# Patient Record
Sex: Male | Born: 1985 | Race: White | Hispanic: No | Marital: Single | Smoking: Never smoker
Health system: Southern US, Community
[De-identification: ages and names within clinical notes are randomized; demographics above are authoritative.]

## PROBLEM LIST (undated history)

## (undated) DIAGNOSIS — J45909 Unspecified asthma, uncomplicated: Secondary | ICD-10-CM

## (undated) DIAGNOSIS — F988 Other specified behavioral and emotional disorders with onset usually occurring in childhood and adolescence: Secondary | ICD-10-CM

## (undated) DIAGNOSIS — L509 Urticaria, unspecified: Secondary | ICD-10-CM

## (undated) DIAGNOSIS — R569 Unspecified convulsions: Secondary | ICD-10-CM

## (undated) DIAGNOSIS — R55 Syncope and collapse: Secondary | ICD-10-CM

## (undated) HISTORY — DX: Unspecified asthma, uncomplicated: J45.909

## (undated) HISTORY — DX: Syncope and collapse: R55

## (undated) HISTORY — DX: Urticaria, unspecified: L50.9

## (undated) HISTORY — DX: Unspecified convulsions: R56.9

## (undated) HISTORY — DX: Other specified behavioral and emotional disorders with onset usually occurring in childhood and adolescence: F98.8

---

## 2007-05-10 ENCOUNTER — Emergency Department (HOSPITAL_COMMUNITY): Admission: EM | Admit: 2007-05-10 | Discharge: 2007-05-10 | Payer: Self-pay | Admitting: Family Medicine

## 2011-09-10 ENCOUNTER — Telehealth: Payer: Self-pay

## 2011-09-10 NOTE — Telephone Encounter (Signed)
Pt needing refill on adderall

## 2011-09-14 ENCOUNTER — Telehealth: Payer: Self-pay | Admitting: Family Medicine

## 2011-09-14 ENCOUNTER — Other Ambulatory Visit: Payer: Self-pay | Admitting: Internal Medicine

## 2011-09-14 MED ORDER — AMPHETAMINE-DEXTROAMPHETAMINE 20 MG PO TABS: 20.0000 mg | ORAL_TABLET | Freq: Two times a day (BID) | ORAL | Status: DC

## 2011-09-14 NOTE — Telephone Encounter (Signed)
Patient notified rx ready to p/u.

## 2011-09-15 NOTE — Telephone Encounter (Signed)
This prescription was provided, the patient signed at pick-up.

## 2011-10-23 ENCOUNTER — Telehealth: Payer: Self-pay

## 2011-10-23 MED ORDER — AMPHETAMINE-DEXTROAMPHETAMINE 20 MG PO TABS: 20.0000 mg | ORAL_TABLET | Freq: Two times a day (BID) | ORAL | Status: DC

## 2011-10-23 NOTE — Telephone Encounter (Signed)
Chart pulled to PA 

## 2011-10-23 NOTE — Telephone Encounter (Signed)
PT IN NEED OF HIS ADDERALL. PLEASE CALL [phone]

## 2011-10-23 NOTE — Telephone Encounter (Signed)
Signed at [employer].  Needs ov in 5/13.

## 2011-10-23 NOTE — Telephone Encounter (Signed)
LMOM notifying patient rx ready for pick up. 

## 2011-12-02 ENCOUNTER — Telehealth: Payer: Self-pay

## 2011-12-02 NOTE — Telephone Encounter (Signed)
Looks like patient needs OV this month.  Can we rx x 1?

## 2011-12-02 NOTE — Telephone Encounter (Signed)
Pt would like his Adderall refilled.

## 2011-12-03 MED ORDER — AMPHETAMINE-DEXTROAMPHETAMINE 20 MG PO TABS: 20.0000 mg | ORAL_TABLET | Freq: Two times a day (BID) | ORAL | Status: DC

## 2011-12-03 NOTE — Telephone Encounter (Signed)
Done. Will need an OV for further refill

## 2011-12-03 NOTE — Telephone Encounter (Signed)
[facility] notifying patient info below, rx in p/up drawer.

## 2011-12-09 ENCOUNTER — Telehealth: Payer: Self-pay

## 2011-12-09 NOTE — Telephone Encounter (Signed)
Medication history was printed for patient to pick up to send to [facility] for drug screening reasons.

## 2011-12-09 NOTE — Telephone Encounter (Signed)
PT STATES HE WAS GIVEN ADDERALL BEFORE FROM [place] AND HE NEED A COPY OF THE PAPER  HAVE TO HAVE IT FOR LEGAL ISSUES. PLEASE CALL [phone]

## 2012-02-01 ENCOUNTER — Ambulatory Visit (INDEPENDENT_AMBULATORY_CARE_PROVIDER_SITE_OTHER): Payer: 59 | Admitting: Family Medicine

## 2012-02-01 VITALS — BP 122/75 | HR 73 | Temp 98.1°F | Resp 18 | Ht 69.5 in | Wt 176.0 lb

## 2012-02-01 DIAGNOSIS — F988 Other specified behavioral and emotional disorders with onset usually occurring in childhood and adolescence: Secondary | ICD-10-CM

## 2012-02-01 MED ORDER — AMPHETAMINE-DEXTROAMPHETAMINE 20 MG PO TABS: 20.0000 mg | ORAL_TABLET | Freq: Two times a day (BID) | ORAL | Status: DC

## 2012-02-01 NOTE — Progress Notes (Signed)
  Subjective:    Patient ID: Phillip Burns, male    DOB: [birth year], 24 y.o.   MRN: [id number]  HPI Phillip Burns is a 26 y.o. male Seen 05/23/11 - started on adderall 10mg  1/2 BID, then increased for symptom improvement since then.    Taking adderall 20mg  - 1 in am , then 1 in afternoon - about 2 in afternoon.  Same dose since December. School full time. Helping at night.  No insomnia. No palpitations. Getting A's.    Review of Systems  Constitutional: Negative for appetite change.  Respiratory: Negative for chest tightness.   Cardiovascular: Negative for chest pain and palpitations.  Psychiatric/Behavioral: Negative for disturbed wake/sleep cycle and dysphoric mood. The patient is not nervous/anxious.        Objective:   Physical Exam  Constitutional: He is oriented to person, place, and time. He appears well-developed and well-nourished.  HENT:  Head: Normocephalic and atraumatic.  Eyes: EOM are normal. Pupils are equal, round, and reactive to light.  Cardiovascular: Normal rate, regular rhythm and normal heart sounds.   No murmur heard. Pulmonary/Chest: Effort normal and breath sounds normal. He has no rales.  Neurological: He is alert and oriented to person, place, and time.  Skin: Skin is warm and dry.  Psychiatric: He has a normal mood and affect. His behavior is normal. Judgment and thought content normal.       Assessment & Plan:  Phillip Burns is a 26 y.o. male 1. ADD (attention deficit disorder)  amphetamine-dextroamphetamine (ADDERALL) 20 MG tablet   Stable symptoms.  Cont adderall at current dose.  Ok to refill for 5 more, then ov needed in 6 months.

## 2012-03-06 ENCOUNTER — Telehealth: Payer: Self-pay

## 2012-03-06 DIAGNOSIS — F988 Other specified behavioral and emotional disorders with onset usually occurring in childhood and adolescence: Secondary | ICD-10-CM

## 2012-03-06 NOTE — Telephone Encounter (Signed)
Phillip Burns needs monthly Adderall prescription.  20mg .  Please call when ready to pick up [phone].

## 2012-03-09 MED ORDER — AMPHETAMINE-DEXTROAMPHETAMINE 20 MG PO TABS: 20.0000 mg | ORAL_TABLET | Freq: Two times a day (BID) | ORAL | Status: DC

## 2012-03-09 NOTE — Telephone Encounter (Signed)
Notified pt Rx ready for p/up 

## 2012-03-09 NOTE — Telephone Encounter (Signed)
Done and printed

## 2012-04-12 ENCOUNTER — Telehealth: Payer: Self-pay

## 2012-04-12 DIAGNOSIS — F988 Other specified behavioral and emotional disorders with onset usually occurring in childhood and adolescence: Secondary | ICD-10-CM

## 2012-04-12 NOTE — Telephone Encounter (Signed)
Pt requesting adderall refill   [facility] phone [phone]

## 2012-04-13 MED ORDER — AMPHETAMINE-DEXTROAMPHETAMINE 20 MG PO TABS: 20.0000 mg | ORAL_TABLET | Freq: Two times a day (BID) | ORAL | Status: DC

## 2012-04-13 NOTE — Telephone Encounter (Signed)
Rx done and ready for pickup 

## 2012-04-13 NOTE — Telephone Encounter (Signed)
Spoke with pt advised  Rx ready to pick up. 

## 2012-04-14 ENCOUNTER — Telehealth: Payer: Self-pay

## 2012-04-14 NOTE — Telephone Encounter (Signed)
Spoke with patient, he needed a copy of medication for work.  Advised patient to go to pharmacy and get a print off copy from them.  Patient states understanding.

## 2012-04-14 NOTE — Telephone Encounter (Signed)
Patient is requesting to pick up a copy of all rxs written from July 1-present. Please call at [phone] when ready.

## 2012-04-14 NOTE — Telephone Encounter (Signed)
Has had Rx for Adderall 20mg  written in July. Lm for him to call me back about this.

## 2012-04-14 NOTE — Telephone Encounter (Signed)
I am really not sure on where I would find this information in the patient's chart.

## 2012-04-30 ENCOUNTER — Encounter: Payer: Self-pay | Admitting: Internal Medicine

## 2012-04-30 DIAGNOSIS — F988 Other specified behavioral and emotional disorders with onset usually occurring in childhood and adolescence: Secondary | ICD-10-CM | POA: Insufficient documentation

## 2012-04-30 DIAGNOSIS — Z0000 Encounter for general adult medical examination without abnormal findings: Secondary | ICD-10-CM | POA: Insufficient documentation

## 2012-04-30 DIAGNOSIS — J45909 Unspecified asthma, uncomplicated: Secondary | ICD-10-CM

## 2012-04-30 HISTORY — DX: Unspecified asthma, uncomplicated: J45.909

## 2012-05-12 ENCOUNTER — Ambulatory Visit: Payer: Self-pay | Admitting: Internal Medicine

## 2012-05-16 ENCOUNTER — Telehealth: Payer: Self-pay

## 2012-05-16 DIAGNOSIS — F988 Other specified behavioral and emotional disorders with onset usually occurring in childhood and adolescence: Secondary | ICD-10-CM

## 2012-05-16 NOTE — Telephone Encounter (Signed)
PT WOULD LIKE A REFILL ON ADDERALL. [facility]ST# [phone]

## 2012-05-17 MED ORDER — AMPHETAMINE-DEXTROAMPHETAMINE 20 MG PO TABS: 20.0000 mg | ORAL_TABLET | Freq: Two times a day (BID) | ORAL | Status: DC

## 2012-05-17 NOTE — Telephone Encounter (Signed)
Left message for him to advise ready to p/u

## 2012-05-17 NOTE — Telephone Encounter (Signed)
Rx done and ready for pickup 

## 2012-05-21 DIAGNOSIS — R569 Unspecified convulsions: Secondary | ICD-10-CM

## 2012-05-21 DIAGNOSIS — R55 Syncope and collapse: Secondary | ICD-10-CM

## 2012-05-21 HISTORY — DX: Unspecified convulsions: R56.9

## 2012-05-21 HISTORY — DX: Syncope and collapse: R55

## 2012-05-22 ENCOUNTER — Emergency Department (HOSPITAL_BASED_OUTPATIENT_CLINIC_OR_DEPARTMENT_OTHER): Payer: 59

## 2012-05-22 ENCOUNTER — Encounter (HOSPITAL_BASED_OUTPATIENT_CLINIC_OR_DEPARTMENT_OTHER): Payer: Self-pay | Admitting: *Deleted

## 2012-05-22 ENCOUNTER — Emergency Department (HOSPITAL_BASED_OUTPATIENT_CLINIC_OR_DEPARTMENT_OTHER)
Admission: EM | Admit: 2012-05-22 | Discharge: 2012-05-22 | Disposition: A | Discharge status: Home / Self Care | Payer: 59 | Attending: Emergency Medicine | Admitting: Emergency Medicine

## 2012-05-22 DIAGNOSIS — J45909 Unspecified asthma, uncomplicated: Secondary | ICD-10-CM | POA: Insufficient documentation

## 2012-05-22 DIAGNOSIS — G40802 Other epilepsy, not intractable, without status epilepticus: Secondary | ICD-10-CM | POA: Insufficient documentation

## 2012-05-22 DIAGNOSIS — R569 Unspecified convulsions: Secondary | ICD-10-CM

## 2012-05-22 LAB — CBC WITH DIFFERENTIAL/PLATELET
Eosinophils Absolute: 0.2 10*3/uL (ref 0.0–0.7)
Hemoglobin: 14.1 g/dL (ref 13.0–17.0)
Lymphocytes Relative: 32 % (ref 12–46)
Lymphs Abs: 3.2 10*3/uL (ref 0.7–4.0)
MCH: 27.4 pg (ref 26.0–34.0)
MCV: 78.3 fL (ref 78.0–100.0)
Monocytes Relative: 8 % (ref 3–12)
Neutrophils Relative %: 59 % (ref 43–77)
Platelets: 294 10*3/uL (ref 150–400)
RBC: 5.15 MIL/uL (ref 4.22–5.81)
WBC: 10.2 10*3/uL (ref 4.0–10.5)

## 2012-05-22 LAB — COMPREHENSIVE METABOLIC PANEL
ALT: 21 U/L (ref 0–53)
Alkaline Phosphatase: 65 U/L (ref 39–117)
BUN: 13 mg/dL (ref 6–23)
CO2: 19 mEq/L (ref 19–32)
GFR calc Af Amer: >90 mL/min (ref >90)
GFR calc non Af Amer: >90 mL/min (ref >90)
Glucose, Bld: 144 mg/dL — ABNORMAL HIGH (ref 70–99)
Potassium: 3.4 mEq/L — ABNORMAL LOW (ref 3.5–5.1)
Sodium: 136 mEq/L (ref 135–145)
Total Bilirubin: 0.6 mg/dL (ref 0.3–1.2)
Total Protein: 8 g/dL (ref 6.0–8.3)

## 2012-05-22 LAB — RAPID URINE DRUG SCREEN, HOSP PERFORMED
Amphetamines: POSITIVE — AB
Benzodiazepines: NOT DETECTED
Cocaine: NOT DETECTED
Tetrahydrocannabinol: NOT DETECTED

## 2012-05-22 LAB — URINALYSIS, ROUTINE W REFLEX MICROSCOPIC
Bilirubin Urine: NEGATIVE
Glucose, UA: NEGATIVE mg/dL
Hgb urine dipstick: NEGATIVE
Nitrite: NEGATIVE
Specific Gravity, Urine: 1.015 (ref 1.005–1.030)
pH: 5.5 (ref 5.0–8.0)

## 2012-05-22 NOTE — ED Notes (Signed)
Dr Rulon Abide, MD at bedside.
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
## 2012-05-22 NOTE — ED Notes (Signed)
Pt presents to ED today with +witnessed seizure by father.  Described as "shaking all over"  Pt does not have hx of same

## 2012-05-22 NOTE — ED Notes (Signed)
CBG 147. RN Joss Benton Notified.
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
## 2012-05-27 NOTE — ED Provider Notes (Signed)
Medical screening examination/treatment/procedure(s) were conducted as a shared visit with non-physician practitioner(s) and myself.  I personally evaluated the patient during the encounter [name], M.D.   [name], MD 05/27/12 1815

## 2012-06-18 ENCOUNTER — Other Ambulatory Visit: Payer: Self-pay

## 2012-06-18 DIAGNOSIS — F988 Other specified behavioral and emotional disorders with onset usually occurring in childhood and adolescence: Secondary | ICD-10-CM

## 2012-06-18 NOTE — Telephone Encounter (Signed)
PATIENT CALLED FOR A RX REFILL amphetamine-dextroamphetamine (ADDERALL) 20 MG tablet. THANK YOU!

## 2012-06-21 ENCOUNTER — Telehealth: Payer: Self-pay | Admitting: Family Medicine

## 2012-06-21 MED ORDER — AMPHETAMINE-DEXTROAMPHETAMINE 20 MG PO TABS: 20.0000 mg | ORAL_TABLET | Freq: Two times a day (BID) | ORAL | Status: DC

## 2012-06-21 NOTE — Telephone Encounter (Signed)
PT CALLED AGAIN TO FIND OUT ABOUT HIS ADDERALL. STATES HE NEED ASAP. PLEASE CALL [phone]

## 2012-06-21 NOTE — Telephone Encounter (Signed)
Message has been sent to Dr Neva Seat for review. Please advise on Adderall pended Rx. Patient due for follow up in Jan. Dillard Pascal

## 2012-06-21 NOTE — Telephone Encounter (Signed)
Called patient and notified adderall rx ready for pick up

## 2012-07-19 ENCOUNTER — Telehealth: Payer: Self-pay

## 2012-07-19 DIAGNOSIS — F988 Other specified behavioral and emotional disorders with onset usually occurring in childhood and adolescence: Secondary | ICD-10-CM

## 2012-07-19 NOTE — Telephone Encounter (Signed)
Pt would like a refill on adderall. Best# [phone]

## 2012-07-20 NOTE — Telephone Encounter (Signed)
Patient needs an appointment in January

## 2012-07-23 ENCOUNTER — Ambulatory Visit (INDEPENDENT_AMBULATORY_CARE_PROVIDER_SITE_OTHER): Payer: 59 | Admitting: Family Medicine

## 2012-07-23 VITALS — BP 126/72 | HR 68 | Temp 98.0°F | Resp 24 | Ht 68.0 in | Wt 179.0 lb

## 2012-07-23 DIAGNOSIS — R569 Unspecified convulsions: Secondary | ICD-10-CM

## 2012-07-23 DIAGNOSIS — R55 Syncope and collapse: Secondary | ICD-10-CM

## 2012-07-23 DIAGNOSIS — F988 Other specified behavioral and emotional disorders with onset usually occurring in childhood and adolescence: Secondary | ICD-10-CM

## 2012-07-23 MED ORDER — AMPHETAMINE-DEXTROAMPHETAMINE 20 MG PO TABS: 20.0000 mg | ORAL_TABLET | Freq: Two times a day (BID) | ORAL | Status: DC

## 2012-07-23 NOTE — Patient Instructions (Addendum)
1. ADD (attention deficit disorder)  amphetamine-dextroamphetamine (ADDERALL) 20 MG tablet, DISCONTINUED: amphetamine-dextroamphetamine (ADDERALL) 20 MG tablet, DISCONTINUED: amphetamine-dextroamphetamine (ADDERALL) 20 MG tablet     You will need follow-up in six months.  Please choose a specific physician or provider to see every time you come in.   Recommend evaluation by cardiology due to recent passing out episode.

## 2012-07-23 NOTE — Progress Notes (Signed)
[address]   [place], [place]  [address]   [phone]  Subjective:    Patient ID: Phillip Burns, male    DOB: 05/21/1986, 27 y.o.   MRN: [id number]  HPIThis 27 y.o. male presents for six month follow-up for ADD.  Starting school and new job.  In school at [employer] first year.  Also working for [employer] in [employer].  Diagnosed age 89 by Dr. Clarene Duke. Previous use of Ritalin, Concerta, Adderall.  Currently taking Adderall; takes daily.  Working well.  No side effects.  Sleeping well; no headaches; no palpitations.  No irritability.  Life is going well.  Emotionally stable.  2. Seizure activity:  Onset in 05/2012. Was with family and suffered a syncopal event followed by onset of seizure activity after falling.  Had not slept much the night before; had stayed up almost all night.   S/p ED visit; CT head negative; labs negative.  Referred to neurologist; went to [facility] ED in [place].  Then went through EEG, maybe MRI and neurology consultation.  ?Neurologist located off [address].  No medication warranted.    No recurrence.  No sleep on day of event.  No family history of seizures.  EKG in ED normal.  No cardiology evaluation or consultation.  Was to follow-up with neurology but did not.  Does not recall chest pain, palpitations, shortness of breath, dizziness.  Does not remember being lightheaded before syncopal event.  3. Health Maintenance: s/p flu vaccine this season.  Review of Systems  Constitutional: Negative for fever, chills, diaphoresis, fatigue and unexpected weight change.  HENT: Negative for hearing loss and tinnitus.   Eyes: Negative for photophobia, redness and visual disturbance.  Gastrointestinal: Negative for nausea, vomiting and diarrhea.  Neurological: Positive for seizures and syncope. Negative for dizziness, tremors, facial asymmetry, speech difficulty, weakness, light-headedness, numbness and headaches.  Psychiatric/Behavioral: Positive for decreased  concentration. Negative for suicidal ideas, hallucinations, sleep disturbance, self-injury and dysphoric mood. The patient is not nervous/anxious.         Past Medical History  Diagnosis Date  . Asthma 04/30/2012  . ADD (attention deficit disorder)   . Seizures 05/21/2012    single episode of seizure; ED visit; s/p neurology consultation with EEG.  . Syncope and collapse 05/21/2012    associated with subsequent seizure activity; ED visit wit CT head, EKG, labs negative.  s/p neurology consult negativ work up including EEG.    History reviewed. No pertinent past surgical history.  Prior to Admission medications   Medication Sig Start Date End Date Taking? Authorizing Provider  amphetamine-dextroamphetamine (ADDERALL) 20 MG tablet Take 1 tablet (20 mg total) by mouth 2 (two) times daily. DO NOT FILL UNTIL 09-20-2012. 07/23/12 08/22/12  Ethelda Chick, MD    No Known Allergies  History   Social History  . Marital Status: Single    Spouse Name: N/A    Number of Children: N/A  . Years of Education: N/A   Occupational History  . Not on file.   Social History Main Topics  . Smoking status: Never Smoker   . Smokeless tobacco: Never Used  . Alcohol Use: Yes  . Drug Use: No  . Sexually Active: Not on file   Other Topics Concern  . 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  Tobacco: none  Alcohol: socially   Drugs: none   Exercises:  Once weekly running.    History reviewed. No pertinent family history.  Objective:   Physical Exam  Nursing note and vitals reviewed. Constitutional: He is oriented to person, place, and time. He appears well-developed and well-nourished. No distress.  HENT:  Head: Normocephalic and atraumatic.  Right Ear: External ear normal.  Left Ear: External ear normal.  Nose: Nose normal.    Mouth/Throat: Oropharynx is clear and moist.  Eyes: Conjunctivae normal and EOM are normal. Pupils are equal, round, and reactive to light.  Neck: Normal range of motion. Neck supple. No thyromegaly present.  Cardiovascular: Normal rate, regular rhythm and normal heart sounds.  Exam reveals no gallop and no friction rub.   No murmur heard. Pulmonary/Chest: Effort normal and breath sounds normal. He has no wheezes. He has no rales.  Lymphadenopathy:    He has no cervical adenopathy.  Neurological: He is alert and oriented to person, place, and time. No cranial nerve deficit. He exhibits normal muscle tone. Coordination normal.  Skin: He is not diaphoretic.  Psychiatric: He has a normal mood and affect. His behavior is normal. Judgment and thought content normal.       Assessment & Plan:   1. ADD (attention deficit disorder)  amphetamine-dextroamphetamine (ADDERALL) 20 MG tablet, DISCONTINUED: amphetamine-dextroamphetamine (ADDERALL) 20 MG tablet, DISCONTINUED: amphetamine-dextroamphetamine (ADDERALL) 20 MG tablet  2. Syncope and collapse    3. Seizure      Meds ordered this encounter  Medications  . DISCONTD: amphetamine-dextroamphetamine (ADDERALL) 20 MG tablet    Sig: Take 1 tablet (20 mg total) by mouth 2 (two) times daily.    Dispense:  60 tablet    Refill:  0  . DISCONTD: amphetamine-dextroamphetamine (ADDERALL) 20 MG tablet    Sig: Take 1 tablet (20 mg total) by mouth 2 (two) times daily. DO NOT FILL UNTIL 08-23-2012.    Dispense:  60 tablet    Refill:  0  . amphetamine-dextroamphetamine (ADDERALL) 20 MG tablet    Sig: Take 1 tablet (20 mg total) by mouth 2 (two) times daily. DO NOT FILL UNTIL 09-20-2012.    Dispense:  60 tablet    Refill:  0

## 2012-07-29 ENCOUNTER — Encounter: Payer: Self-pay | Admitting: Family Medicine

## 2012-07-29 DIAGNOSIS — R55 Syncope and collapse: Secondary | ICD-10-CM | POA: Insufficient documentation

## 2012-07-29 DIAGNOSIS — R569 Unspecified convulsions: Secondary | ICD-10-CM | POA: Insufficient documentation

## 2012-07-29 NOTE — Assessment & Plan Note (Signed)
New. ED records reviewed in detail during visit.  Referred to neurology after ED visit.  Reports undergoing EEG and possible MRI with neurology consult; obtain records.  Recommend cardiology consultation to rule out cardiac origin of syncopal event; pt to consider but declined referral today.

## 2012-07-29 NOTE — Assessment & Plan Note (Signed)
New. Associated with syncopal event. ED records reviewed in detail.  S/p neurology consultation; obtain records.

## 2012-07-29 NOTE — Assessment & Plan Note (Signed)
Controlled; no change in medication at this time; discussed new office policy that must see same provider for each refill.  Also discussed office policy on controlled substances; pt expressed understanding.  Also recommend making appointments at [facility] center for all follow-ups to confirm that patient seeing the same provider each visit for ADD management.

## 2012-08-17 ENCOUNTER — Telehealth: Payer: Self-pay | Admitting: Radiology

## 2012-08-17 NOTE — Telephone Encounter (Signed)
[phone] pharmacy called, patient wants to get his Adderall filled early it is not due to be filled until Feb 2nd patient states he is going out of town.

## 2012-08-17 NOTE — Telephone Encounter (Signed)
OK with early refill as long as patient does not have a history of frequent early refills of Adderall.  Please confirm with pharmacy. KMS

## 2012-08-18 NOTE — Telephone Encounter (Signed)
Pharmacy notified that it was ok to fill. Pt does not have history of filling early.

## 2012-10-26 ENCOUNTER — Other Ambulatory Visit: Payer: Self-pay | Admitting: *Deleted

## 2012-10-26 ENCOUNTER — Ambulatory Visit (INDEPENDENT_AMBULATORY_CARE_PROVIDER_SITE_OTHER): Payer: 59 | Admitting: Family Medicine

## 2012-10-26 VITALS — BP 116/68 | HR 84 | Temp 98.1°F | Resp 16 | Ht 68.5 in | Wt 174.0 lb

## 2012-10-26 DIAGNOSIS — F988 Other specified behavioral and emotional disorders with onset usually occurring in childhood and adolescence: Secondary | ICD-10-CM

## 2012-10-26 MED ORDER — AMPHETAMINE-DEXTROAMPHETAMINE 20 MG PO TABS: 20.0000 mg | ORAL_TABLET | Freq: Two times a day (BID) | ORAL | Status: DC

## 2012-10-26 MED ORDER — AMPHETAMINE-DEXTROAMPHETAMINE 20 MG PO TABS: ORAL_TABLET | ORAL | Status: DC

## 2012-10-26 NOTE — Progress Notes (Signed)
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 He was originally prescribed by Dr. Clarene Duke. Dr. Dallas Schimke was the initial prescriber here. He has gotten his prescriptions from various peoples in the past at this office. He was given the  controlled substances policy last visit, but he didn't know the names of who to attach himself to. He takes the medicine 20 mg twice daily on a fairly consistent basis since he has classes every day 5 days a week. The medicine directly helps him.  Objective: Physical exam not done today  Assessment: ADHD  Plan: Refills for April May and June. He is to return in July and instructed to see Dr. Patsy Lager at that time since she was the original prescriber at this practice and had reviewed Dr. Clarene Duke notes

## 2012-10-26 NOTE — Patient Instructions (Signed)
Take medication as directed.  See Dr. Shanda Bumps Copland in July. She is the only one who will be prescribing your medication. If you come when she is not here, you will have to come back another time.  [facility] Policy for Prescribing Controlled Substances (Revised 05/2012) 1. Prescriptions for controlled substances will be filled by ONE provider at [facility] with whom you have established and developed a plan for your care, including follow-up. 2. You are encouraged to schedule an appointment with your prescriber at our appointment center for follow-up visits whenever possible. 3. If you request a prescription for the controlled substance while at [facility] for an acute problem (with someone other than your regular prescriber), you MAY be given a ONE-TIME prescription for a 30-day supply of the controlled substance, to allow time for you to return to see your regular prescriber for additional prescriptions. 4.

## 2012-12-23 ENCOUNTER — Telehealth: Payer: Self-pay

## 2012-12-23 NOTE — Telephone Encounter (Signed)
[facility]. Called patient to advise.

## 2012-12-23 NOTE — Telephone Encounter (Signed)
Pt is needing to talk with someone about getting his adderall rx refilled early  Best number [phone]

## 2012-12-23 NOTE — Telephone Encounter (Signed)
Pt is needing to talk with someone about getting a refill of his adderall early  Best number [phone]

## 2012-12-23 NOTE — Telephone Encounter (Signed)
Ok to fill 12/26/12 rx early, needs to follow up in July as planned.  Needs to establish with one provider to prescribe this

## 2012-12-23 NOTE — Telephone Encounter (Signed)
Why does he need it early? [facility] [address], he has to go for [employer] assignment on June 7th so he needs this today or tomorrow. Is this okay.

## 2013-02-04 ENCOUNTER — Ambulatory Visit (INDEPENDENT_AMBULATORY_CARE_PROVIDER_SITE_OTHER): Payer: 59 | Admitting: Family Medicine

## 2013-02-04 VITALS — BP 114/72 | HR 75 | Temp 98.1°F | Resp 18 | Ht 68.5 in | Wt 179.0 lb

## 2013-02-04 DIAGNOSIS — F988 Other specified behavioral and emotional disorders with onset usually occurring in childhood and adolescence: Secondary | ICD-10-CM

## 2013-02-04 MED ORDER — AMPHETAMINE-DEXTROAMPHETAMINE 20 MG PO TABS: ORAL_TABLET | ORAL | Status: DC

## 2013-02-04 NOTE — Patient Instructions (Addendum)
Please contact us by phone or e-mail in 3 months- we can give you 3 months more of adderall then, but will need to see you in 6 months.   Take care!

## 2013-02-04 NOTE — Progress Notes (Signed)
[facility] [address] [place] [address] [phone]  Date:  02/04/2013   Name:  Phillip Burns   DOB:  August 13, 1985   MRN:  [id number]  PCP:  Evette Georges, MD    Chief Complaint: rx refill   History of Present Illness:  Phillip Burns is a 27 y.o. very pleasant male patient who presents with the following:  He is here today to refill his adderall.  He is Clinical research associate, and works in Office manager.  He is tranfering to [employer] but is not quite sure when he will finish school  He takes aderall 20 BID.  He has been on this dose for some time and feels that it controls his sx quite well.   His appetite is ok, he is sleeping well.   No tobacco, no drugs.  He did have one seizure in January which was thought to be due to sleep deprivation. This has not recurred and he is not on any anti seizure medications   Patient Active Problem List   Diagnosis Date Noted  . Syncope and collapse 07/29/2012  . Seizure 07/29/2012  . Asthma 04/30/2012  . Preventative health care 04/30/2012  . ADD (attention deficit disorder)     Past Medical History  Diagnosis Date  . Asthma 04/30/2012  . ADD (attention deficit disorder)   . Seizures 05/21/2012    single episode of seizure; ED visit; s/p neurology consultation with EEG.  . Syncope and collapse 05/21/2012    associated with subsequent seizure activity; ED visit wit CT head, EKG, labs negative.  s/p neurology consult negativ work up including EEG.    History reviewed. No pertinent past surgical history.  History  Substance Use Topics  . Smoking status: Never Smoker   . Smokeless tobacco: Never Used  . Alcohol Use: Yes    History reviewed. No pertinent family history.  No Known Allergies  Medication list has been reviewed and updated.  Current Outpatient Prescriptions on File Prior to Visit  Medication Sig Dispense Refill  . amphetamine-dextroamphetamine (ADDERALL) 20 MG tablet Take one twice  daily. Do not fill until 11/25/2012  60 tablet  0  . amphetamine-dextroamphetamine (ADDERALL) 20 MG tablet Take one twice daily. Do not fill until 12/26/2012  60 tablet  0  . amphetamine-dextroamphetamine (ADDERALL) 20 MG tablet Take 1 tablet (20 mg total) by mouth 2 (two) times daily.  60 tablet  0   No current facility-administered medications on file prior to visit.    Review of Systems:  As per HPI- otherwise negative.   Physical Examination: Filed Vitals:   02/04/13 1051  BP: 114/72  Pulse: 99  Temp: 98.1 F (36.7 C)  Resp: 18   Filed Vitals:   02/04/13 1051  Height: 5' 8.5" (1.74 m)  Weight: 179 lb (81.194 kg)   Body mass index is 26.82 kg/(m^2). Ideal Body Weight: Weight in (lb) to have BMI = 25: 166.5  GEN: WDWN, NAD, Non-toxic, A & O x 3, looks well HEENT: Atraumatic, Normocephalic. Neck supple. No masses, No LAD. Ears and Nose: No external deformity. CV: RRR, No M/G/R. No JVD. No thrill. No extra heart sounds. PULM: CTA B, no wheezes, crackles, rhonchi. No retractions. No resp. distress. No accessory muscle use. EXTR: No c/c/e NEURO Normal gait.  PSYCH: Normally interactive. Conversant. Not depressed or anxious appearing.  Calm demeanor.    Assessment and Plan: ADD (attention deficit disorder) - Plan: amphetamine-dextroamphetamine (ADDERALL) 20 MG tablet, amphetamine-dextroamphetamine (ADDERALL)  20 MG tablet, amphetamine-dextroamphetamine (ADDERALL) 20 MG tablet  Refilled adderall for 3 months.  Recheck in 6 months.    Signed Abbe Amsterdam, MD

## 2013-03-10 ENCOUNTER — Ambulatory Visit: Payer: 59 | Admitting: Family Medicine

## 2013-03-29 ENCOUNTER — Telehealth: Payer: Self-pay

## 2013-03-29 NOTE — Telephone Encounter (Signed)
PT STATES HE WILL BE LEAVING FOR 3 WEEKS DUE TO SERVICE AND WOULD LIKE TO HAVE HIS ADDERALL REFILLED AGAIN PLEASE CALL [phone]

## 2013-03-29 NOTE — Telephone Encounter (Signed)
He should have one for September. He wants to know if he can fill early since he is going out of town for [employer] service please advise. [facility] [address]

## 2013-03-30 NOTE — Telephone Encounter (Signed)
He has an rx dated to fill 9/15.  Spoke with pharmD and they can fill it early.  However, he may have to pay as it is too early for refill.  He will speak with his insurance company if necessary

## 2013-05-09 ENCOUNTER — Encounter: Payer: Self-pay | Admitting: Family Medicine

## 2013-05-09 ENCOUNTER — Ambulatory Visit (INDEPENDENT_AMBULATORY_CARE_PROVIDER_SITE_OTHER): Payer: 59 | Admitting: Family Medicine

## 2013-05-09 VITALS — BP 110/70 | Temp 98.7°F | Ht 69.0 in | Wt 181.0 lb

## 2013-05-09 DIAGNOSIS — R55 Syncope and collapse: Secondary | ICD-10-CM

## 2013-05-09 DIAGNOSIS — F988 Other specified behavioral and emotional disorders with onset usually occurring in childhood and adolescence: Secondary | ICD-10-CM

## 2013-05-09 DIAGNOSIS — R569 Unspecified convulsions: Secondary | ICD-10-CM

## 2013-05-09 MED ORDER — LISDEXAMFETAMINE DIMESYLATE 20 MG PO CAPS: 20.0000 mg | ORAL_CAPSULE | ORAL | Status: DC

## 2013-05-09 NOTE — Patient Instructions (Signed)
Let's try the long-acting medication,,,,,,,,vyvanse ........ it's a 20 mg pill take one in the morning  Call in 2 weeks with a progress report and leave a voicemail with Fleet Contras

## 2013-05-09 NOTE — Progress Notes (Signed)
  Subjective:    Patient ID: Phillip Burns, male    DOB: 1986-04-11, 27 y.o.   MRN: [id number]  HPI Phillip Burns is a 27 year old single male nonsmoker son of a patient of mine ,,,,,,, Crecencio Mc,,,,,,,,, who comes in today as a new patient  He's always been in excellent health he said no chronic health problems. Her urine half ago he had a seizure. He was taken emergency room subsequent neurologic evaluation should the seizure was secondary to sleep deprivation. He was placed on no medication and has not had any recurrence.  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 Clarene Duke at age 27 have ADD. He was on Ritalin Concerta and Adderall but the Adderall makes him feel jumpy and nervous he would like to know if there is any other options. He's never tried any of the newer medications  Does not smoke only drinks occasional drink of alcohol.  Vaccinations up-to-date all of her up-to-date when he went the [employer] in 2009   Review of Systems Review of systems otherwise negative    Objective:   Physical Exam  Well-developed well-nourished male no acute distress vital signs stable he is afebrile      Assessment & Plan:  Healthy male  Adult ADD trial ,,,vyvanse

## 2013-06-09 ENCOUNTER — Telehealth: Payer: Self-pay | Admitting: Family Medicine

## 2013-06-09 DIAGNOSIS — F988 Other specified behavioral and emotional disorders with onset usually occurring in childhood and adolescence: Secondary | ICD-10-CM

## 2013-06-09 MED ORDER — LISDEXAMFETAMINE DIMESYLATE 20 MG PO CAPS: 20.0000 mg | ORAL_CAPSULE | Freq: Every day | ORAL | Status: DC

## 2013-06-09 MED ORDER — LISDEXAMFETAMINE DIMESYLATE 20 MG PO CAPS: 20.0000 mg | ORAL_CAPSULE | ORAL | Status: DC

## 2013-06-09 NOTE — Telephone Encounter (Signed)
Rx ready for pick up and patient is aware 

## 2013-06-09 NOTE — Telephone Encounter (Signed)
Pt request refill lisdexamfetamine (VYVANSE) 20 MG capsule °

## 2013-08-02 ENCOUNTER — Telehealth: Payer: Self-pay | Admitting: Family Medicine

## 2013-08-02 DIAGNOSIS — F988 Other specified behavioral and emotional disorders with onset usually occurring in childhood and adolescence: Secondary | ICD-10-CM

## 2013-08-02 MED ORDER — LISDEXAMFETAMINE DIMESYLATE 20 MG PO CAPS: 20.0000 mg | ORAL_CAPSULE | Freq: Two times a day (BID) | ORAL | Status: DC

## 2013-08-02 NOTE — Telephone Encounter (Signed)
Okay per Dr Todd.  Rx ready for pick up and patient is aware. 

## 2013-08-02 NOTE — Telephone Encounter (Signed)
Pt would like to up his rx lisdexamfetamine (VYVANSE) 20 MG capsule, working just needs more of. Pt has one left but would like to trade that one in. 3 mo supply

## 2013-08-02 NOTE — Telephone Encounter (Signed)
Patient would like to take Vyvanse 20 mg twice daily. Is this okay?

## 2013-09-16 ENCOUNTER — Telehealth: Payer: Self-pay | Admitting: Family Medicine

## 2013-09-16 NOTE — Telephone Encounter (Signed)
Patient calling into to triage line with concerns about Vyvance. Attempted to contact the patient back at [phone]. Reached voicemail. Left message to please return call to office for assistance. No contact.

## 2013-09-16 NOTE — Telephone Encounter (Signed)
Pt called back to asked if he could have an increase in his vyvanse. Not working that well, pt states he told triage nurse all the information. (?)  pls call

## 2013-09-19 NOTE — Telephone Encounter (Signed)
Spoke with patient and he was advised to call Dr Nolen MuMckinney per Dr Tawanna Coolerodd.

## 2013-09-24 IMAGING — CT CT HEAD W/O CM
2 series · 16 of 30 positions shown, 20 images · non-contrast
Comparison: None.

CLINICAL DATA: Seizure.  Stuck left eye object while falling.  Left
eye hematoma

CT HEAD WITHOUT CONTRAST
TECHNIQUE: Contiguous axial images were obtained from the base of
the skull through the vertex without contrast.

[Series 2: head 4.8 h37s · axial · 0.46mm/px · z∈[-92,+51]mm · 13 of 36 slices shown, 17 images]
[im 3/36  brain]
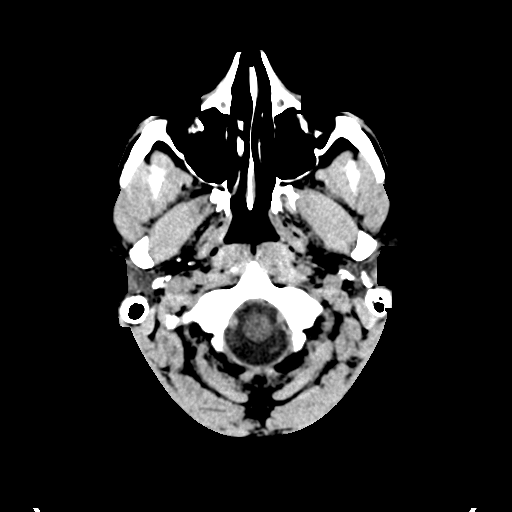
[im 3/36  bone]
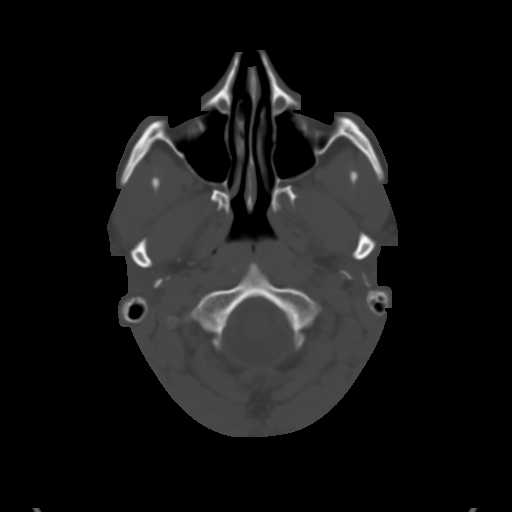
[im 6/36  brain]
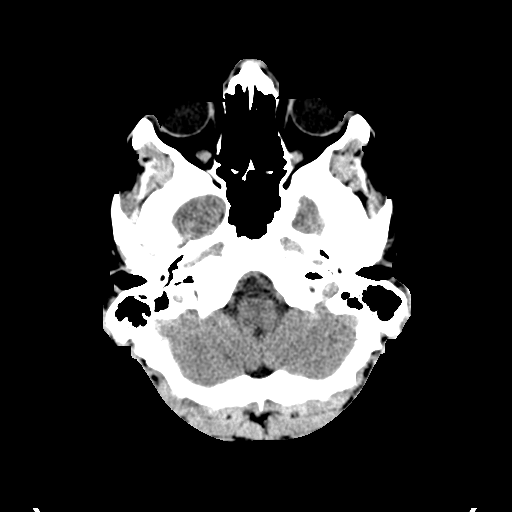
[im 8/36  brain]
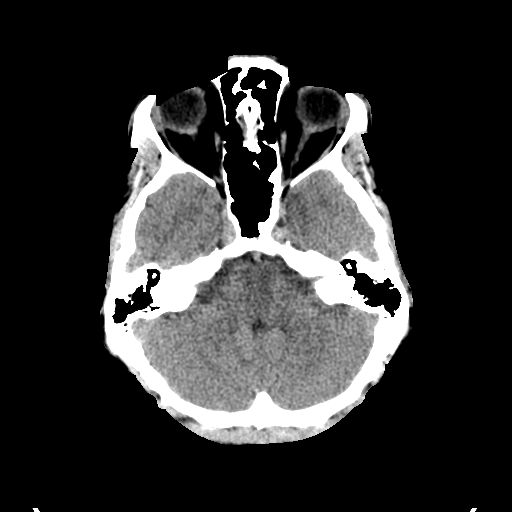
[im 11/36  brain]
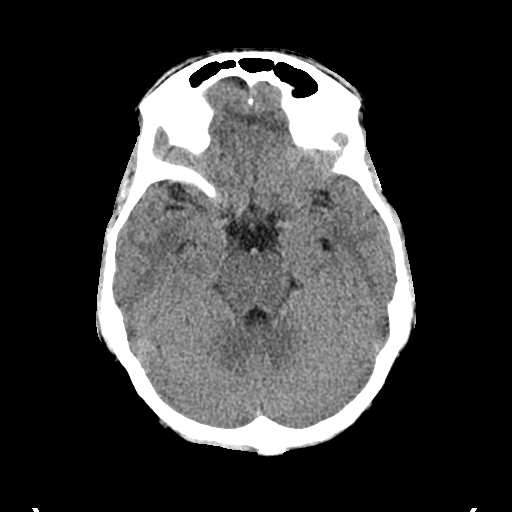
[im 13/36  brain]
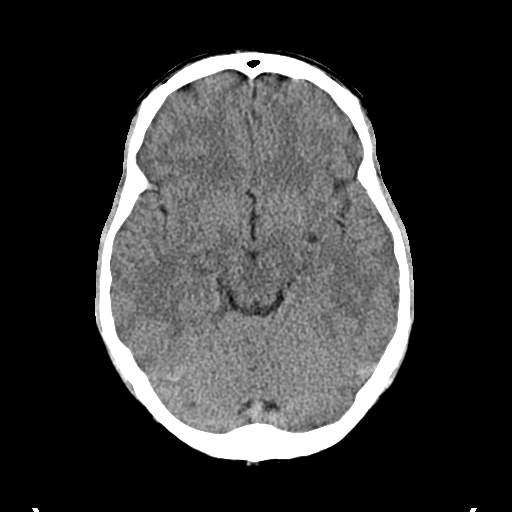
[im 13/36  bone]
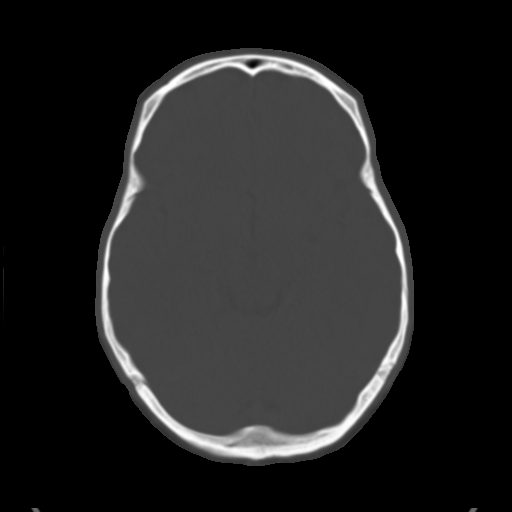
[im 16/36  brain]
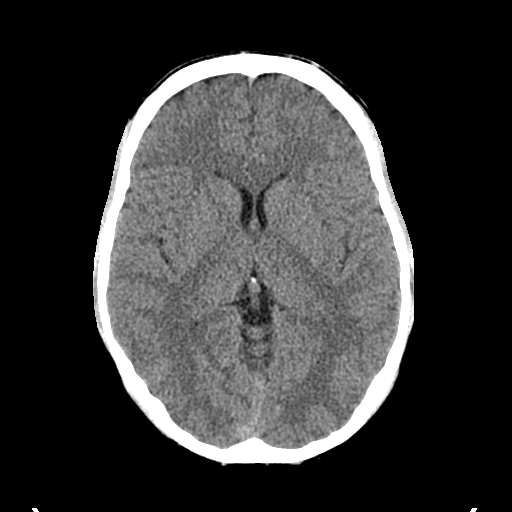
[im 18/36  brain]
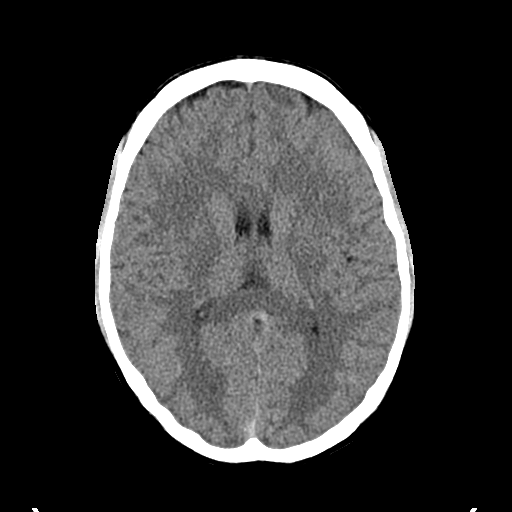
[im 21/36  brain]
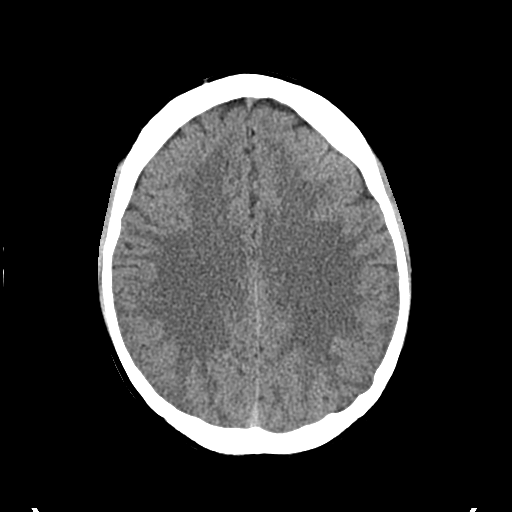
[im 23/36  brain]
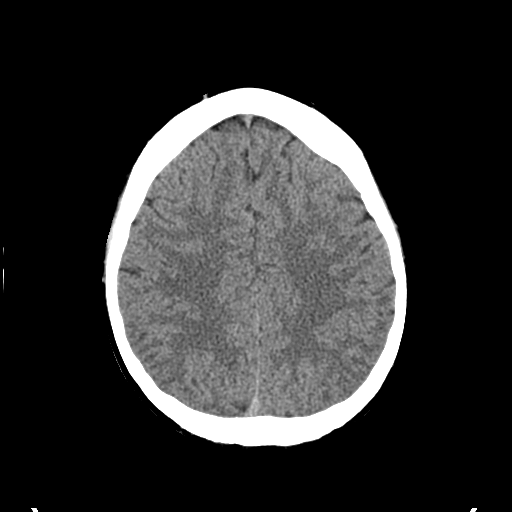
[im 23/36  bone]
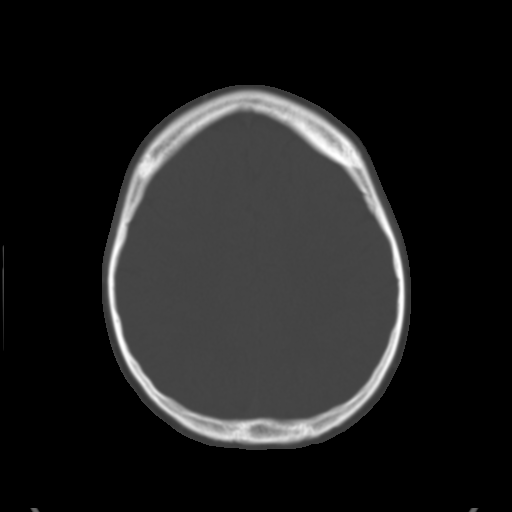
[im 26/36  brain]
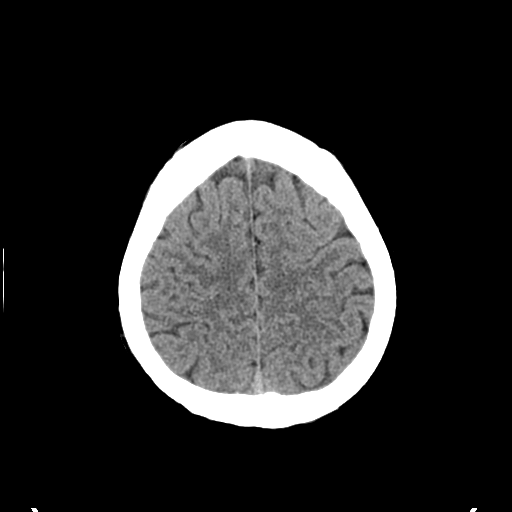
[im 28/36  brain]
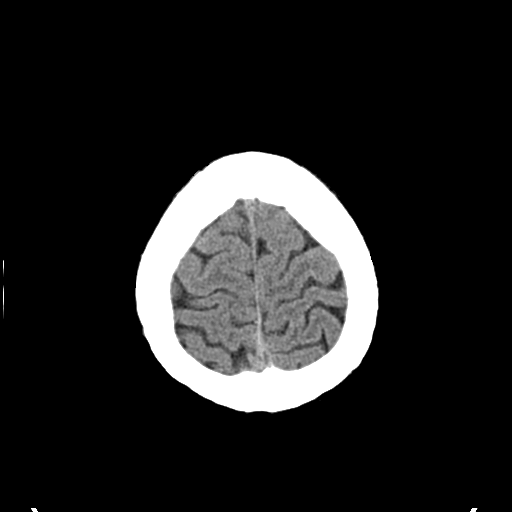
[im 31/36  brain]
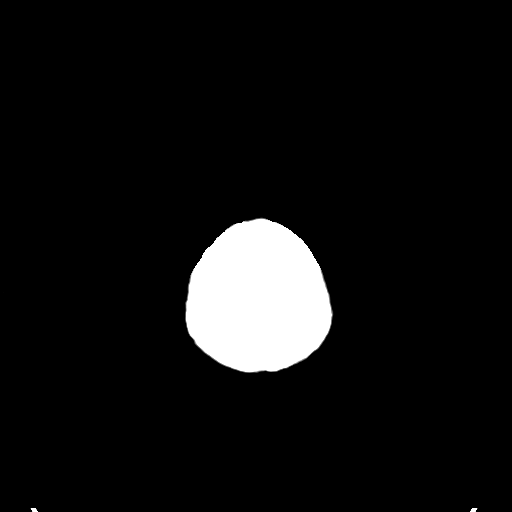
[im 33/36  brain]
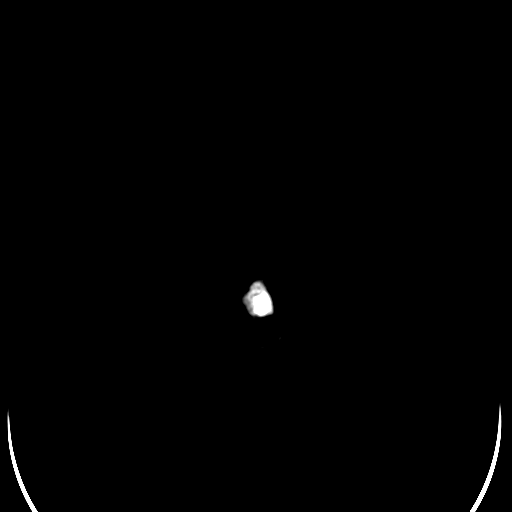
[im 33/36  bone]
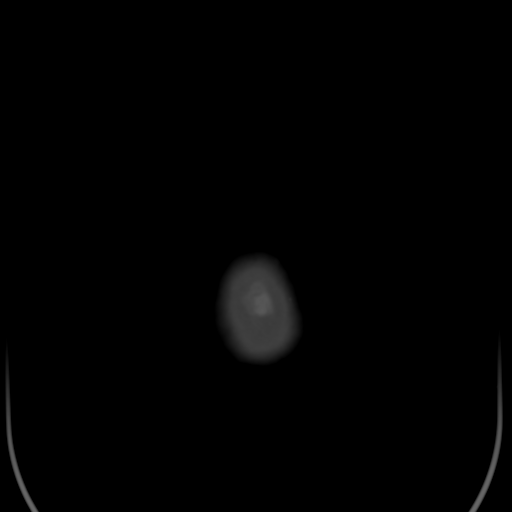

[Series 3: head 4.8 h60s bone · axial · 0.46mm/px · z∈[-92,-44]mm · 3 of 36 slices shown]
[im 3/36  bone]
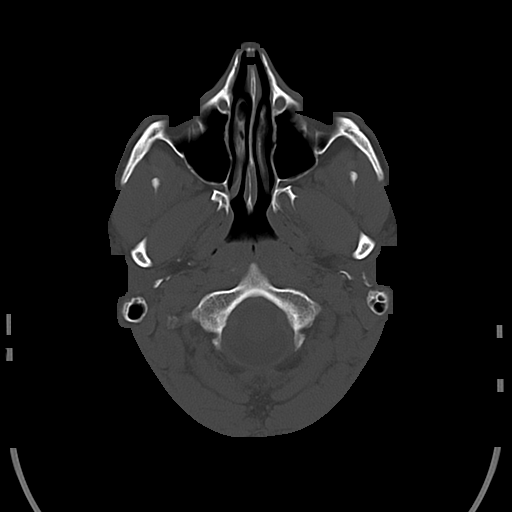
[im 8/36  bone]
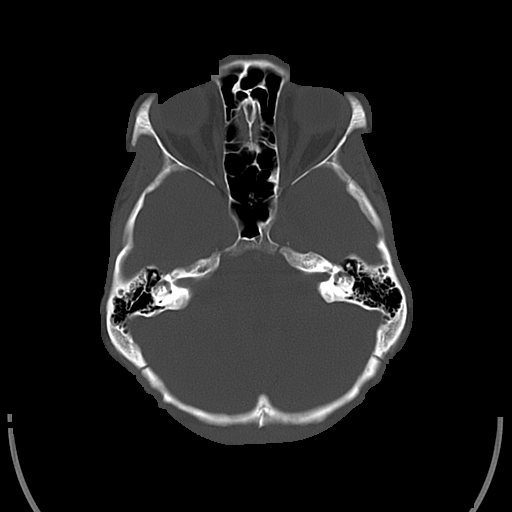
[im 13/36  bone]
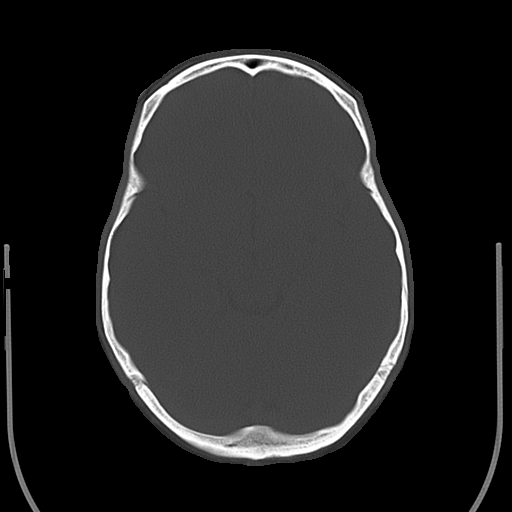

[16 of 30 positions shown; findings below may reference images not displayed]

FINDINGS: The brain has a normal appearance without evidence for
hemorrhage, infarction, hydrocephalus, or mass lesion.  There is no
extra axial fluid collection.  The skull is  normal. Retention cyst
versus polyp is identified in the right maxillary sinus.  There is
mild left-sided preseptal soft tissue swelling.
IMPRESSION: 1.  No acute intracranial abnormalities.

## 2013-10-31 ENCOUNTER — Telehealth: Payer: Self-pay | Admitting: Family Medicine

## 2013-10-31 DIAGNOSIS — F988 Other specified behavioral and emotional disorders with onset usually occurring in childhood and adolescence: Secondary | ICD-10-CM

## 2013-10-31 MED ORDER — LISDEXAMFETAMINE DIMESYLATE 20 MG PO CAPS: 20.0000 mg | ORAL_CAPSULE | Freq: Two times a day (BID) | ORAL | Status: DC

## 2013-10-31 NOTE — Telephone Encounter (Signed)
Pt requesting refill of lisdexamfetamine (VYVANSE) 20 MG capsule 

## 2013-10-31 NOTE — Telephone Encounter (Signed)
Rx ready for patient to pick up 

## 2014-01-27 ENCOUNTER — Telehealth: Payer: Self-pay | Admitting: Family Medicine

## 2014-01-27 NOTE — Telephone Encounter (Signed)
Pt is wanting to know if he can switch back to adderall, pt states vyvanse is to expensive and the adderall works well for him. Pt will be out meds in 1wk, please call when available for pick up.

## 2014-01-30 MED ORDER — AMPHETAMINE-DEXTROAMPHETAMINE 20 MG PO TABS: 20.0000 mg | ORAL_TABLET | Freq: Two times a day (BID) | ORAL | Status: DC

## 2014-01-31 NOTE — Telephone Encounter (Signed)
Rx ready for pick up and Left message on machine  

## 2014-02-10 ENCOUNTER — Ambulatory Visit: Payer: 59 | Admitting: Physician Assistant

## 2014-04-28 ENCOUNTER — Telehealth: Payer: Self-pay | Admitting: Family Medicine

## 2014-04-28 NOTE — Telephone Encounter (Signed)
Pt request refill of the following: amphetamine-dextroamphetamine (ADDERALL) 20 MG tablet ° ° °Phamacy:  Pick up  ° °

## 2014-05-01 MED ORDER — AMPHETAMINE-DEXTROAMPHETAMINE 20 MG PO TABS: 20.0000 mg | ORAL_TABLET | Freq: Two times a day (BID) | ORAL | Status: DC

## 2014-05-01 NOTE — Telephone Encounter (Signed)
rx up front for p/u, pt will need office visit, left detail message on pts cell phone

## 2014-06-08 ENCOUNTER — Ambulatory Visit (INDEPENDENT_AMBULATORY_CARE_PROVIDER_SITE_OTHER): Payer: 59 | Admitting: Family Medicine

## 2014-06-08 ENCOUNTER — Encounter: Payer: Self-pay | Admitting: Family Medicine

## 2014-06-08 VITALS — BP 110/80 | Temp 98.4°F | Wt 194.0 lb

## 2014-06-08 DIAGNOSIS — F988 Other specified behavioral and emotional disorders with onset usually occurring in childhood and adolescence: Secondary | ICD-10-CM

## 2014-06-08 DIAGNOSIS — F909 Attention-deficit hyperactivity disorder, unspecified type: Secondary | ICD-10-CM

## 2014-06-08 MED ORDER — AMPHETAMINE-DEXTROAMPHETAMINE 20 MG PO TABS
20.0000 mg | ORAL_TABLET | Freq: Two times a day (BID) | ORAL | Status: DC
Start: 2014-06-08 — End: 2016-12-10

## 2014-06-08 MED ORDER — AMPHETAMINE-DEXTROAMPHETAMINE 20 MG PO TABS: 20.0000 mg | ORAL_TABLET | Freq: Two times a day (BID) | ORAL | Status: DC

## 2014-06-08 NOTE — Progress Notes (Signed)
Pre visit review using our clinic review tool, if applicable. No additional management support is needed unless otherwise documented below in the visit note. 

## 2014-06-08 NOTE — Patient Instructions (Signed)
Continue your current medications  Remember to call 2 weeks from being out of your third prescriptions and leave a voicemail with Fleet ContrasRachel for refills  Return in one year for follow-up sooner if any problems

## 2014-06-08 NOTE — Progress Notes (Signed)
   Subjective:    Patient ID: Phillip Burns, male    DOB: March 29, 1986, 28 y.o.   MRN: [id number]  HPI Molli HazardMatthew is a 28 year old single male nonsmoker who comes in today for annual evaluation because of underlying adult ADD  He takes short-acting Adderall 20 mg twice a day and is doing well. 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   Review of Systems Review of systems otherwise negative    Objective:   Physical Exam  Well-developed well-nourished male no acute distress vital signs stable he is afebrile      Assessment & Plan:  Adult ADD,,,,,,,,,

## 2015-01-29 ENCOUNTER — Telehealth: Payer: Self-pay | Admitting: Family Medicine

## 2015-01-29 NOTE — Telephone Encounter (Signed)
Pt was wondering if you could send a list of his meds to the [facility]. Pt was hoping to pick up a copy of the list to take to his MD over there.

## 2015-01-29 NOTE — Telephone Encounter (Signed)
Copy ready for pick up and Left message on machine for patient.

## 2016-07-26 DIAGNOSIS — H5213 Myopia, bilateral: Secondary | ICD-10-CM | POA: Diagnosis not present

## 2016-07-26 DIAGNOSIS — H52223 Regular astigmatism, bilateral: Secondary | ICD-10-CM | POA: Diagnosis not present

## 2016-12-08 ENCOUNTER — Telehealth: Payer: Self-pay | Admitting: Family Medicine

## 2016-12-09 NOTE — Telephone Encounter (Signed)
Error

## 2016-12-10 ENCOUNTER — Ambulatory Visit (INDEPENDENT_AMBULATORY_CARE_PROVIDER_SITE_OTHER): Payer: BLUE CROSS/BLUE SHIELD | Admitting: Family Medicine

## 2016-12-10 ENCOUNTER — Encounter: Payer: Self-pay | Admitting: Family Medicine

## 2016-12-10 VITALS — BP 118/80 | Temp 97.6°F | Wt 180.0 lb

## 2016-12-10 DIAGNOSIS — F988 Other specified behavioral and emotional disorders with onset usually occurring in childhood and adolescence: Secondary | ICD-10-CM | POA: Insufficient documentation

## 2016-12-10 MED ORDER — LISDEXAMFETAMINE DIMESYLATE 20 MG PO CAPS: 20.0000 mg | ORAL_CAPSULE | Freq: Every day | ORAL | 0 refills | Status: DC

## 2016-12-10 MED FILL — VYVANSE 20 MG CAPSULE: 20 | 30 days supply | Qty: 30 | Fill #0

## 2016-12-10 NOTE — Patient Instructions (Signed)
Vyvanse 20 mg................Marland Kitchen. 1 daily  Send us a message on my chart 2 weeks from being out of your third prescription for refills.

## 2016-12-10 NOTE — Progress Notes (Signed)
Phillip Burns is a 31 year old single male nonsmoker [place] [employer] who comes in today for follow-up of ADD  He's had a long-standing history of adult ADD. He's here for medication refills.  Review of systems otherwise negative  BP 118/80 (BP Location: Left Arm)   Temp 97.6 F (36.4 C) (Oral)   Wt 180 lb (81.6 kg)   BMI 26.58 kg/m  Jellies well-developed well-nourished male no acute distress  #1 adult ADD.........Marland Kitchen. Vyvanse

## 2017-01-12 MED FILL — VYVANSE 20 MG CAPSULE: 20 | 30 days supply | Qty: 30 | Fill #0

## 2017-02-10 MED FILL — VYVANSE 20 MG CAPSULE: 20 | 30 days supply | Qty: 30 | Fill #0

## 2017-03-20 ENCOUNTER — Telehealth: Payer: Self-pay | Admitting: Family Medicine

## 2017-03-20 NOTE — Telephone Encounter (Signed)
Pt request refill  lisdexamfetamine (VYVANSE) 20 MG capsule 90 day

## 2017-03-24 NOTE — Telephone Encounter (Signed)
Pt calling to check the status of Rx °

## 2017-03-24 NOTE — Telephone Encounter (Signed)
Pt  Is requesting for refill on VYVANSE 20 mg capsule, last filled on 12/10/2016, Pt last OV was 12/10/2016 and last labs were 06/09/2012, please Advise if ok to refill this medication.

## 2017-03-25 ENCOUNTER — Telehealth: Payer: Self-pay

## 2017-03-25 ENCOUNTER — Other Ambulatory Visit: Payer: Self-pay | Admitting: Family Medicine

## 2017-03-25 MED ORDER — LISDEXAMFETAMINE DIMESYLATE 20 MG PO CAPS: 20.0000 mg | ORAL_CAPSULE | Freq: Every day | ORAL | 0 refills | Status: DC

## 2017-03-25 MED FILL — VYVANSE 20 MG CAPSULE: 20 | 30 days supply | Qty: 30 | Fill #0

## 2017-03-25 NOTE — Telephone Encounter (Signed)
Rx has been signed, pt called and confirmed that Rx is ready for pick up at the front desk.

## 2017-04-27 MED FILL — VYVANSE 20 MG CAPSULE: 20 | 30 days supply | Qty: 30 | Fill #0

## 2017-04-28 ENCOUNTER — Encounter: Payer: Self-pay | Admitting: *Deleted

## 2017-04-28 ENCOUNTER — Telehealth: Payer: Self-pay | Admitting: Family Medicine

## 2017-04-28 NOTE — Telephone Encounter (Signed)
Letter done and left voicemail letting pt know letter ready

## 2017-04-28 NOTE — Telephone Encounter (Signed)
Per Dr. Tawanna Cooler it's okay to write letter.

## 2017-04-28 NOTE — Telephone Encounter (Signed)
° ° ° °  Pt call to say his job is asking whta type of medication that he takes. Pt ask if a letter can be written to his employee stating that taking the below med will not affect him doing his job.  Pt would like to pick up the letter      lisdexamfetamine (VYVANSE) 20 MG capsule

## 2017-05-27 MED FILL — VYVANSE 20 MG CAPSULE: 20 | 30 days supply | Qty: 30 | Fill #0

## 2017-07-16 ENCOUNTER — Telehealth: Payer: Self-pay | Admitting: Family Medicine

## 2017-07-16 NOTE — Telephone Encounter (Signed)
Copied from CRM [id number]. Topic: Quick Communication - See Telephone Encounter >> Jul 16, 2017  4:51 PM Floria RavelingStovall, Shana A wrote: CRM for notification. See Telephone encounter for:  pt called in and needs refill on his lisdexamfetamine (VYVANSE) 20 MG capsule [[id number]  Phamacy - [facility]   07/16/17.

## 2017-07-17 NOTE — Telephone Encounter (Signed)
Called pt left a detailed message regarding to his request for refill on VYVANSE, notified pt that dr Tawanna Coolerodd will be back in the office on Monday, explained pt that I will call him back when Rx is signed and ready for pick up

## 2017-07-20 ENCOUNTER — Other Ambulatory Visit: Payer: Self-pay

## 2017-07-20 MED ORDER — LISDEXAMFETAMINE DIMESYLATE 20 MG PO CAPS: 20.0000 mg | ORAL_CAPSULE | Freq: Every day | ORAL | 0 refills | Status: DC

## 2017-07-20 MED ORDER — LISDEXAMFETAMINE DIMESYLATE 20 MG PO CAPS
20.0000 mg | ORAL_CAPSULE | Freq: Every day | ORAL | 0 refills | Status: DC
Start: 2017-07-20 — End: 2017-07-20

## 2017-07-20 MED FILL — VYVANSE 20 MG CAPSULE: 20 | 30 days supply | Qty: 30 | Fill #0

## 2017-07-20 NOTE — Telephone Encounter (Signed)
Rx for VYVANSE has been signed and pt is aware that Rx is ready for pick up.

## 2017-09-03 MED FILL — VYVANSE 20 MG CAPSULE: 20 | 30 days supply | Qty: 30 | Fill #0

## 2017-10-02 MED FILL — VYVANSE 20 MG CAPSULE: 20 | 30 days supply | Qty: 30 | Fill #0

## 2017-11-16 ENCOUNTER — Telehealth: Payer: Self-pay | Admitting: Family Medicine

## 2017-11-16 NOTE — Telephone Encounter (Signed)
Copied from CRM [id number]. Topic: Quick Communication - Rx Refill/Question >> Nov 16, 2017  4:03 PM Diana Eves B wrote: Medication: lisdexamfetamine (VYVANSE) 20 MG capsule Has the patient contacted their pharmacy? Yes.   (Agent: If no, request that the patient contact the pharmacy for the refill.) Preferred Pharmacy (with phone number or street name): pick up script at the office.  Agent: Please be advised that RX refills may take up to 3 business days. We ask that you follow-up with your pharmacy.

## 2017-11-16 NOTE — Telephone Encounter (Signed)
Last OV was 12/10/2016 and Rx was last filled on 07/20/2017, pt request refill,please Advise

## 2017-11-16 NOTE — Telephone Encounter (Signed)
ERROR in Signing

## 2017-11-16 NOTE — Telephone Encounter (Signed)
Copied from CRM [id number]. Topic: Quick Communication - Rx Refill/Question >> Nov 16, 2017  4:05 PM Diana Eves B wrote: Medication:  lisdexamfetamine (VYVANSE) 20 MG capsule Has the patient contacted their pharmacy? Yes.   (Agent: If no, request that the patient contact the pharmacy for the refill.) Preferred Pharmacy (with phone number or street name): pt would like to pick up script at office  Agent: Please be advised that RX refills may take up to 3 business days. We ask that you follow-up with your pharmacy.

## 2017-11-17 ENCOUNTER — Other Ambulatory Visit: Payer: Self-pay | Admitting: Family Medicine

## 2017-11-17 ENCOUNTER — Other Ambulatory Visit: Payer: Self-pay

## 2017-11-17 MED ORDER — LISDEXAMFETAMINE DIMESYLATE 20 MG PO CAPS: 20.0000 mg | ORAL_CAPSULE | Freq: Every day | ORAL | 0 refills | Status: DC

## 2017-11-17 MED FILL — VYVANSE 20 MG CAPSULE: 20 | 30 days supply | Qty: 30 | Fill #0

## 2017-11-17 NOTE — Telephone Encounter (Signed)
Pt picked up Rx for VYVANSE from the office this afternoon

## 2017-11-17 NOTE — Telephone Encounter (Signed)
Pt is on the way to pick up Rx.

## 2017-11-17 NOTE — Telephone Encounter (Signed)
Phillip Burns please call Laray Anger and tell him we send his [facility]

## 2017-11-17 NOTE — Telephone Encounter (Signed)
Spoke with pt stated that Rx for VYVANSE 20 mg was sent to the wrong pharmacy, pt request for Rx to be printed and will pick up in the office

## 2017-12-17 MED FILL — VYVANSE 20 MG CAPSULE: 20 | 30 days supply | Qty: 30 | Fill #0

## 2018-01-19 MED FILL — VYVANSE 20 MG CAPSULE: 20 | 30 days supply | Qty: 30 | Fill #0

## 2018-02-26 ENCOUNTER — Other Ambulatory Visit: Payer: Self-pay | Admitting: Family Medicine

## 2018-02-26 NOTE — Telephone Encounter (Signed)
Called pt left a message to call the office regarding his refill request.

## 2018-03-01 NOTE — Telephone Encounter (Signed)
Spoke with pt Advised to schedule a f/u appointment for his Vyvanse refill since pt was last seen 11/2016. Pt state that he didn't feel like he needs this Rx right now but will notify the office if he changes his mind.

## 2018-03-17 ENCOUNTER — Ambulatory Visit: Payer: BLUE CROSS/BLUE SHIELD | Admitting: Family Medicine

## 2018-03-17 ENCOUNTER — Encounter: Payer: Self-pay | Admitting: Family Medicine

## 2018-03-17 VITALS — BP 122/80 | HR 63 | Temp 98.3°F | Wt 197.8 lb

## 2018-03-17 DIAGNOSIS — R6889 Other general symptoms and signs: Secondary | ICD-10-CM | POA: Diagnosis not present

## 2018-03-17 DIAGNOSIS — Z13220 Encounter for screening for lipoid disorders: Secondary | ICD-10-CM

## 2018-03-17 DIAGNOSIS — L509 Urticaria, unspecified: Secondary | ICD-10-CM | POA: Diagnosis not present

## 2018-03-17 DIAGNOSIS — Z872 Personal history of diseases of the skin and subcutaneous tissue: Secondary | ICD-10-CM

## 2018-03-17 LAB — LIPID PANEL
Cholesterol: 169 mg/dL (ref 0–200)
HDL: 47.5 mg/dL (ref >39.00)
LDL Cholesterol: 106 mg/dL — ABNORMAL HIGH (ref 0–99)
NONHDL: 121.23
Total CHOL/HDL Ratio: 4
Triglycerides: 78 mg/dL (ref 0.0–149.0)
VLDL: 15.6 mg/dL (ref 0.0–40.0)

## 2018-03-17 LAB — COMPREHENSIVE METABOLIC PANEL
ALK PHOS: 47 U/L (ref 39–117)
ALT: 37 U/L (ref 0–53)
AST: 39 U/L — AB (ref 0–37)
Albumin: 4.5 g/dL (ref 3.5–5.2)
BILIRUBIN TOTAL: 0.4 mg/dL (ref 0.2–1.2)
BUN: 19 mg/dL (ref 6–23)
CO2: 25 mEq/L (ref 19–32)
CREATININE: 0.97 mg/dL (ref 0.40–1.50)
Calcium: 9.6 mg/dL (ref 8.4–10.5)
Chloride: 105 mEq/L (ref 96–112)
GFR: 96.73 mL/min (ref >60.00)
GLUCOSE: 90 mg/dL (ref 70–99)
Potassium: 4.4 mEq/L (ref 3.5–5.1)
SODIUM: 137 meq/L (ref 135–145)
TOTAL PROTEIN: 7.2 g/dL (ref 6.0–8.3)

## 2018-03-17 LAB — TSH: TSH: 2.78 u[IU]/mL (ref 0.35–4.50)

## 2018-03-17 LAB — CBC WITH DIFFERENTIAL/PLATELET
BASOS ABS: 0 10*3/uL (ref 0.0–0.1)
Basophils Relative: 0.2 % (ref 0.0–3.0)
EOS PCT: 1.8 % (ref 0.0–5.0)
Eosinophils Absolute: 0.1 10*3/uL (ref 0.0–0.7)
HCT: 39.6 % (ref 39.0–52.0)
Hemoglobin: 13.2 g/dL (ref 13.0–17.0)
LYMPHS ABS: 2.4 10*3/uL (ref 0.7–4.0)
Lymphocytes Relative: 32.3 % (ref 12.0–46.0)
MCHC: 33.3 g/dL (ref 30.0–36.0)
MCV: 83.3 fl (ref 78.0–100.0)
MONOS PCT: 8.7 % (ref 3.0–12.0)
Monocytes Absolute: 0.7 10*3/uL (ref 0.1–1.0)
NEUTROS PCT: 57 % (ref 43.0–77.0)
Neutro Abs: 4.3 10*3/uL (ref 1.4–7.7)
Platelets: 258 10*3/uL (ref 150.0–400.0)
RBC: 4.75 Mil/uL (ref 4.22–5.81)
RDW: 13.1 % (ref 11.5–15.5)
WBC: 7.5 10*3/uL (ref 4.0–10.5)

## 2018-03-17 MED ORDER — HYDROXYZINE HCL 25 MG PO TABS: 25.0000 mg | ORAL_TABLET | Freq: Three times a day (TID) | ORAL | 2 refills | Status: DC | PRN

## 2018-03-17 MED FILL — hydrOXYzine HCL 25 MG TABS: 25 | 10 days supply | Qty: 30 | Fill #0

## 2018-03-17 NOTE — Progress Notes (Signed)
  Phillip PickerMatthew D Mccaughey DOB: 1986/01/14 Encounter date: 03/17/2018  This is a 32 y.o. male who presents with Chief Complaint  Patient presents with  . Rash    reoccuring, notices after working out/increased body temp, pt states that the rash normally goes away on its own after a few hours, no pain/itching,    History of present illness:  Noticed for last couple of years. Seems like it is happening more often. Has tried benadryl after exercise; no improvement. Usually takes a few hours to go away; usually gone with sleep. Also with stress, heat outside. Just face. No tongue swelling, lip swelling, trouble with breathing.   Has tried claritin, zyrtec, benadryl. Still broke out even if taking larger dose in the morning. No improvement in duration of hives after dose benadryl.     No similar family history.   No Known Allergies No outpatient medications have been marked as taking for the 03/17/18 encounter (Office Visit) with Wynn BankerKoberlein, Johnte Portnoy C, MD.    Review of Systems  Constitutional: Negative for chills, fatigue and fever.  Respiratory: Negative for cough, chest tightness, shortness of breath and wheezing.   Cardiovascular: Negative for chest pain, palpitations and leg swelling.  Skin: Positive for rash (see hpi; urticaria face only; significant amount covering face).    Objective:  BP 122/80 (BP Location: Left Arm, Patient Position: Sitting, Cuff Size: Normal)   Pulse 63   Temp 98.3 F (36.8 C) (Oral)   Wt 197 lb 12.8 oz (89.7 kg)   SpO2 98%   BMI 29.21 kg/m   Weight: 197 lb 12.8 oz (89.7 kg)   BP Readings from Last 3 Encounters:  03/17/18 122/80  12/10/16 118/80  06/08/14 110/80   Wt Readings from Last 3 Encounters:  03/17/18 197 lb 12.8 oz (89.7 kg)  12/10/16 180 lb (81.6 kg)  06/08/14 194 lb (88 kg)    Physical Exam  Constitutional: He appears well-developed and well-nourished. No distress.  HENT:  Mouth/Throat: Uvula is midline, oropharynx is clear and moist  and mucous membranes are normal.  Cardiovascular: Normal rate, regular rhythm and normal heart sounds. Exam reveals no friction rub.  No murmur heard. No lower extremity edema  Pulmonary/Chest: Effort normal and breath sounds normal. No respiratory distress. He has no wheezes. He has no rales.  Skin: Skin is warm and dry. No rash noted. Rash is not urticarial.  No current urticaria or rash.  Psychiatric: He has a normal mood and affect.    Assessment/Plan 1. 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 Coming frequently. Will check some baseline bloodwork but symptoms are very specific to heat reaction. Since lack of improvement with antihistamines; will refer to allergy. - Ambulatory referral to Allergy  2. Urticaria See above. Trial zyrtec in morning and hydroxyzine at bedtime. He is sensitive to sleepiness with benadryl so will give slightly lower dose. Encouraged him to message me through mychart for dose adjusting. - CBC with Differential/Platelet; Future - hydrOXYzine (ATARAX/VISTARIL) 25 MG tablet; Take 1 tablet (25 mg total) by mouth 3 (three) times daily as needed.  Dispense: 30 tablet; Refill: 2  3. Lipid screening Hasn't been screened/had bloodwork in some time. Will complete for baseline. - Lipid panel; Future  4. Heat intolerance See above.  - CBC with Differential/Platelet; Future - Comprehensive metabolic panel; Future - TSH; Future   Return if symptoms worsen or fail to improve.  Theodis ShoveJunell Markiyah Gahm, MD

## 2018-03-17 NOTE — Addendum Note (Signed)
Addended by: Laure KidneyLARK, Maxime Beckner J on: 03/17/2018 02:15 PM   Modules accepted: Orders

## 2018-03-17 NOTE — Patient Instructions (Signed)
Zyrtec 10mg  in morning; hydroxyzine 25mg  at bedtime.

## 2018-04-20 ENCOUNTER — Encounter: Payer: Self-pay | Admitting: Allergy and Immunology

## 2018-04-20 ENCOUNTER — Ambulatory Visit: Payer: BLUE CROSS/BLUE SHIELD | Admitting: Allergy and Immunology

## 2018-04-20 DIAGNOSIS — L500 Allergic urticaria: Secondary | ICD-10-CM

## 2018-04-20 NOTE — Assessment & Plan Note (Addendum)
Unclear etiology. We were unable to perform skin tests today due to recent administration of antihistamine. NSAIDs commonly exacerbate urticaria but are not the underlying etiology in this case. Physical urticarias are negative by history (i.e. pressure-induced, temperature, vibration, solar, etc.). History and lesions are not consistent with urticaria pigmentosa so I am not suspicious for mastocytosis. There are no concomitant symptoms concerning for anaphylaxis.   The patient is scheduled to return in the near future for allergy skin testing after having been off of antihistamines for at least 3 days.  Further recommendations will be made at that time based upon skin test results.

## 2018-04-20 NOTE — Progress Notes (Signed)
New Patient Note  RE: Phillip Burns MRN: [id number] DOB: [birth year] Date of Office Visit: 04/20/2018  Referring provider: Wynn Banker, MD Primary care provider: Roderick Pee, MD  Chief Complaint: Urticaria and Rash   History of present illness: Phillip Burns is a 32 y.o. male seen today in consultation requested by Phillip Shove, MD.  Over the past 5 years, Phillip Burns has experienced recurrent episodes of hives. The hives have appeared at different times over his face.  The lesions are described as erythematous, raised, and pruritic.  Individual hives last less than 24 hours without leaving residual pigmentation or bruising. He denies concomitant angioedema, cardiopulmonary symptoms and GI symptoms. He has not experienced unexpected weight loss, recurrent fevers or drenching night sweats. No specific medication, food, skin care product, detergent, soap, or other environmental triggers have been identified. He has noticed that it tends to happen in the winter when he goes from cold temperatures to hot/warm temperatures seems to trigger the hives, but it also occurs at "random" times. The symptoms do not seem to correlate with NSAIDs use. There does seem to be a correlation with emotional stress. He did not have symptoms consistent with a respiratory tract infection at the time of symptom onset. Phillip Burns has tried to control symptoms with hydroxazine which have offered adequate temporary relief. He has not been evaluated and treated in the emergency department for these symptoms. Skin biopsy has not been performed. Recently, he has been experiencing hives 1-2 times a week on average. He experiences occasional nasal congestion, rhinorrhea, and postnasal drainage.  No significant seasonal symptom variation has been noted nor have specific environmental triggers been identified.  He does not take medications in an attempt to control the symptoms.   Assessment and plan: Recurrent  urticaria Unclear etiology. We were unable to perform skin tests today due to recent administration of antihistamine. NSAIDs commonly exacerbate urticaria but are not the underlying etiology in this case. Physical urticarias are negative by history (i.e. pressure-induced, temperature, vibration, solar, etc.). History and lesions are not consistent with urticaria pigmentosa so I am not suspicious for mastocytosis. There are no concomitant symptoms concerning for anaphylaxis.   The patient is scheduled to return in the near future for allergy skin testing after having been off of antihistamines for at least 3 days.  Further recommendations will be made at that time based upon skin test results.   Diagnostics: Allergy skin testing: We were unable to perform skin tests today due to recent administration of antihistamine.    Physical examination: Blood pressure 108/70, pulse 76, temperature 97.8 F (36.6 C), temperature source Oral, resp. rate 18, height 5\' 9"  (1.753 m), weight 198 lb 9.6 oz (90.1 kg), SpO2 97 %.  General: Alert, interactive, in no acute distress. HEENT: TMs pearly gray, turbinates mildly edematous with clear discharge, post-pharynx mildly erythematous. Neck: Supple without lymphadenopathy. Lungs: Clear to auscultation without wheezing, rhonchi or rales. CV: Normal S1, S2 without murmurs. Abdomen: Nondistended, nontender. Skin: Warm and dry, without lesions or rashes. Extremities:  No clubbing, cyanosis or edema. Neuro:   Grossly intact.  Review of systems:  Review of systems negative except as noted in HPI / PMHx or noted below: Review of Systems  Constitutional: Negative.   HENT: Negative.   Eyes: Negative.   Respiratory: Negative.   Cardiovascular: Negative.   Gastrointestinal: Negative.   Genitourinary: Negative.   Musculoskeletal: Negative.   Skin: Negative.   Neurological: Negative.   Endo/Heme/Allergies: Negative.   Psychiatric/Behavioral:  Negative.      Past medical history:  Past Medical History:  Diagnosis Date  . ADD (attention deficit disorder)   . Asthma 04/30/2012  . Seizures (HCC) 05/21/2012   single episode of seizure; ED visit; s/p neurology consultation with EEG.  . Syncope and collapse 05/21/2012   associated with subsequent seizure activity; ED visit wit CT head, EKG, labs negative.  s/p neurology consult negativ work up including EEG.  . Urticaria     Past surgical history:  History reviewed. No pertinent surgical history.  Family history: Family History  Problem Relation Age of Onset  . Allergic rhinitis Neg Hx   . Asthma Neg Hx   . Eczema Neg Hx   . Urticaria Neg Hx     Social history: Social History   Socioeconomic History  . Marital status: Single    Spouse name: Not on file  . Number of children: Not on file  . Years of education: Not on file  . Highest education level: Not on file  Occupational History  . Not on file  Social Needs  . Financial resource strain: Not on file  . Food insecurity:    Worry: Not on file    Inability: Not on file  . Transportation needs:    Medical: Not on file    Non-medical: Not on file  Tobacco Use  . Smoking status: Never Smoker  . Smokeless tobacco: Never Used  Substance and Sexual Activity  . Alcohol use: Yes  . Drug use: No  . Sexual activity: Not on file  Lifestyle  . Physical activity:    Days per week: Not on file    Minutes per session: Not on file  . Stress: Not on file  Relationships  . Social connections:    Talks on phone: Not on file    Gets together: Not on file    Attends religious service: Not on file    Active member of club or organization: Not on file    Attends meetings of clubs or organizations: Not on file    Relationship status: Not on file  . Intimate partner violence:    Fear of current or ex partner: Not on file    Emotionally abused: Not on file    Physically abused: Not on file    Forced sexual activity: Not on file   Other Topics Concern  . 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     Tobacco: none      Alcohol: socially      Drugs: none      Exercises:  Once weekly running.   Environmental History: The patient lives in a 30 year old house with carpeting throughout and central air/heat.  There is a dog in the home which has access to his bedroom.  There is no known mold/water damage in the home.  He is a non-smoker.  Allergies as of 04/20/2018   No Known Allergies     Medication List        Accurate as of 04/20/18  5:16 PM. Always use your most recent med list.          hydrOXYzine 25 MG tablet Commonly known as:  ATARAX/VISTARIL Take 1 tablet (25 mg total) by mouth 3 (three) times  daily as needed.       Known medication allergies: No Known Allergies  I appreciate the opportunity to take part in Vega's care. Please do not hesitate to contact me with questions.  Sincerely,   R. Jorene Guest, MD

## 2018-04-20 NOTE — Patient Instructions (Addendum)
Recurrent urticaria Unclear etiology. We were unable to perform skin tests today due to recent administration of antihistamine. NSAIDs commonly exacerbate urticaria but are not the underlying etiology in this case. Physical urticarias are negative by history (i.e. pressure-induced, temperature, vibration, solar, etc.). History and lesions are not consistent with urticaria pigmentosa so I am not suspicious for mastocytosis. There are no concomitant symptoms concerning for anaphylaxis.   The patient is scheduled to return in the near future for allergy skin testing after having been off of antihistamines for at least 3 days.  Further recommendations will be made at that time based upon skin test results.   Return in about 3 weeks (around 05/11/2018) for allergy skin testing after having been off of antihistamines for at least 3 days.

## 2018-04-22 MED FILL — hydrOXYzine HCL 25 MG TABS: 25 | 10 days supply | Qty: 30 | Fill #1

## 2018-05-11 ENCOUNTER — Ambulatory Visit: Payer: BLUE CROSS/BLUE SHIELD | Admitting: Allergy and Immunology

## 2018-07-07 ENCOUNTER — Encounter: Payer: Self-pay | Admitting: Family Medicine

## 2018-07-07 ENCOUNTER — Ambulatory Visit: Payer: BLUE CROSS/BLUE SHIELD | Admitting: Family Medicine

## 2018-07-07 VITALS — BP 120/80 | HR 87 | Temp 98.4°F | Wt 209.6 lb

## 2018-07-07 DIAGNOSIS — L509 Urticaria, unspecified: Secondary | ICD-10-CM | POA: Diagnosis not present

## 2018-07-07 DIAGNOSIS — F988 Other specified behavioral and emotional disorders with onset usually occurring in childhood and adolescence: Secondary | ICD-10-CM

## 2018-07-07 MED ORDER — LISDEXAMFETAMINE DIMESYLATE 20 MG PO CAPS: 20.0000 mg | ORAL_CAPSULE | Freq: Every day | ORAL | 0 refills | Status: DC

## 2018-07-07 MED ORDER — HYDROXYZINE HCL 25 MG PO TABS: 25.0000 mg | ORAL_TABLET | Freq: Three times a day (TID) | ORAL | 1 refills | Status: DC | PRN

## 2018-07-07 MED FILL — hydrOXYzine HCL 25 MG TABS: 25 | 30 days supply | Qty: 90 | Fill #0

## 2018-07-07 MED FILL — VYVANSE 20 MG CAPSULE: 20 | 30 days supply | Qty: 30 | Fill #0

## 2018-07-07 NOTE — Progress Notes (Signed)
Phillip Burns DOB: 1985-08-27 Encounter date: 07/07/2018  This is a 32 y.o. male who presents with Chief Complaint  Patient presents with  . Medication Refill    History of present illness: Last seen in August and referred to allergy for recurrent urticaria. Was scheduled to return for allergy skin testing with further recommendations pending this but no showed for 10/22 appointment. Did complete allergy testing and was told negative.   Has been awhile since he has flared with hives. Used to be worse with cooler weather. Does ok taking medication. Not making him too drowsy. Was just using as needed. Was better than taking benadryl. Taking claritin in the morning and then the vistaril if needed in evening before going into work.    Was also on vyvanse in the past and would like to restart. Hasn' thad the vyvanse now in 7 months. Going back to midnight shift and helps him concentrate during shifts. Feels that he tolerated this better than adderall. Has had focus issues since childhood. Has been on medications back in high school and then intermittently since. Focus is better on medication. Vyvanse 20mg  once daily was enough for him and worked well for focus. Initially stopped medication because he was doing evening shift and seemed to do ok, but much harder for him on midnights.   No Known Allergies Current Meds  Medication Sig  . hydrOXYzine (ATARAX/VISTARIL) 25 MG tablet Take 1 tablet (25 mg total) by mouth 3 (three) times daily as needed.  . [DISCONTINUED] hydrOXYzine (ATARAX/VISTARIL) 25 MG tablet Take 1 tablet (25 mg total) by mouth 3 (three) times daily as needed.    Review of Systems  Constitutional: Negative for chills, fatigue and fever.  Respiratory: Negative for cough, chest tightness, shortness of breath and wheezing.   Cardiovascular: Negative for chest pain, palpitations and leg swelling.  Skin:       Been doing much better with hives. Hasn't had outbreak since around  time he was here last - 03/19/18.   Psychiatric/Behavioral: Positive for decreased concentration. Negative for sleep disturbance. The patient is not nervous/anxious.     Objective:  BP 120/80 (BP Location: Left Arm, Patient Position: Sitting, Cuff Size: Normal)   Pulse 87   Temp 98.4 F (36.9 C) (Oral)   Wt 209 lb 9.6 oz (95.1 kg)   SpO2 97%   BMI 30.95 kg/m   Weight: 209 lb 9.6 oz (95.1 kg)   BP Readings from Last 3 Encounters:  07/07/18 120/80  04/20/18 108/70  03/17/18 122/80   Wt Readings from Last 3 Encounters:  07/07/18 209 lb 9.6 oz (95.1 kg)  04/20/18 198 lb 9.6 oz (90.1 kg)  03/17/18 197 lb 12.8 oz (89.7 kg)    Physical Exam Constitutional:      General: He is not in acute distress.    Appearance: He is well-developed.  Cardiovascular:     Rate and Rhythm: Normal rate and regular rhythm.     Heart sounds: Normal heart sounds. No murmur. No friction rub.     Comments: No lower extremity edema Pulmonary:     Effort: Pulmonary effort is normal. No respiratory distress.     Breath sounds: Normal breath sounds. No wheezing or rales.  Neurological:     Mental Status: He is alert and oriented to person, place, and time.  Psychiatric:        Behavior: Behavior normal.     Assessment/Plan  1. Urticaria Continue claritin/atarax as needed. - hydrOXYzine (ATARAX/VISTARIL) 25 MG  tablet; Take 1 tablet (25 mg total) by mouth 3 (three) times daily as needed.  Dispense: 90 tablet; Refill: 1  2. Attention deficit disorder (ADD) without hyperactivity Stable on vyvanse; will restart.  - lisdexamfetamine (VYVANSE) 20 MG capsule; Take 1 capsule (20 mg total) by mouth daily.  Dispense: 90 capsule; Refill: 0    Return in about 6 months (around 01/06/2019) for physical exam.    Theodis ShoveJunell Deiondre Harrower, MD

## 2018-08-04 ENCOUNTER — Encounter: Payer: BLUE CROSS/BLUE SHIELD | Admitting: Family Medicine

## 2018-08-18 ENCOUNTER — Ambulatory Visit: Payer: BLUE CROSS/BLUE SHIELD | Admitting: Family Medicine

## 2018-08-18 ENCOUNTER — Encounter: Payer: Self-pay | Admitting: Family Medicine

## 2018-08-18 VITALS — BP 120/70 | HR 89 | Temp 98.0°F | Ht 69.0 in | Wt 207.0 lb

## 2018-08-18 DIAGNOSIS — R748 Abnormal levels of other serum enzymes: Secondary | ICD-10-CM

## 2018-08-18 DIAGNOSIS — L500 Allergic urticaria: Secondary | ICD-10-CM

## 2018-08-18 DIAGNOSIS — F988 Other specified behavioral and emotional disorders with onset usually occurring in childhood and adolescence: Secondary | ICD-10-CM

## 2018-08-18 NOTE — Progress Notes (Signed)
Phillip Burns DOB: [birth year] Encounter date: 08/18/2018  This is a 33 y.o. male who presents to establish care. Chief Complaint  Patient presents with  . Transitions Of Care    History of present illness: No specific concerns today.   Syncopal episode in 2013; had some shaking and concerns for seizure but work up was negative. No events since.   Asthma: more issue when younger. Not used inhaler in about 15 years. No hospitalizations from this.  ADD: dx in middle school. Does well on the vyvanse. Able to focus, complete tasks. Just uses on days that he works so uses about 15 tablets/month.    Past Medical History:  Diagnosis Date  . ADD (attention deficit disorder)   . Asthma 04/30/2012  . Seizures (HCC) 05/21/2012   single episode of seizure; ED visit; s/p neurology consultation with EEG.  . Syncope and collapse 05/21/2012   associated with subsequent seizure activity; ED visit wit CT head, EKG, labs negative.  s/p neurology consult negativ work up including EEG.  . Urticaria    History reviewed. No pertinent surgical history. No Known Allergies Current Meds  Medication Sig  . hydrOXYzine (ATARAX/VISTARIL) 25 MG tablet Take 1 tablet (25 mg total) by mouth 3 (three) times daily as needed.  Marland Kitchen lisdexamfetamine (VYVANSE) 20 MG capsule Take 1 capsule (20 mg total) by mouth daily.   Social History   Tobacco Use  . Smoking status: Never Smoker  . Smokeless tobacco: Never Used  Substance Use Topics  . Alcohol use: Yes    Comment: 6-7 drinks per setting   Family History  Problem Relation Age of Onset  . Heart Problems Brother   . Lung cancer Maternal Grandmother        smoker  . Cancer Paternal Grandfather        brain  . Allergic rhinitis Neg Hx   . Asthma Neg Hx   . Eczema Neg Hx   . Urticaria Neg Hx      Review of Systems  Constitutional: Negative for chills, fatigue and fever.  Respiratory: Negative for cough, chest tightness, shortness of breath and  wheezing.   Cardiovascular: Negative for chest pain, palpitations and leg swelling.    Objective:  BP 120/70 (BP Location: Left Arm, Patient Position: Sitting, Cuff Size: Normal)   Pulse 89   Temp 98 F (36.7 C) (Oral)   Ht 5\' 9"  (1.753 m)   Wt 207 lb (93.9 kg)   SpO2 98%   BMI 30.57 kg/m   Weight: 207 lb (93.9 kg)   BP Readings from Last 3 Encounters:  08/18/18 120/70  07/07/18 120/80  04/20/18 108/70   Wt Readings from Last 3 Encounters:  08/18/18 207 lb (93.9 kg)  07/07/18 209 lb 9.6 oz (95.1 kg)  04/20/18 198 lb 9.6 oz (90.1 kg)    Physical Exam Constitutional:      General: He is not in acute distress.    Appearance: He is well-developed.  Cardiovascular:     Rate and Rhythm: Normal rate and regular rhythm.     Heart sounds: Normal heart sounds. No murmur. No friction rub.  Pulmonary:     Effort: Pulmonary effort is normal. No respiratory distress.     Breath sounds: Normal breath sounds. No wheezing or rales.  Abdominal:     General: Abdomen is flat. Bowel sounds are normal.     Palpations: There is no hepatomegaly or splenomegaly.     Tenderness: There is no abdominal tenderness.  Musculoskeletal:     Right lower leg: No edema.     Left lower leg: No edema.  Neurological:     Mental Status: He is alert and oriented to person, place, and time.  Psychiatric:        Behavior: Behavior normal.     Assessment/Plan: 1. Elevated liver enzymes We did discuss working on decreasing alcohol intake and how this can affect the liver over time. Previous AST was slightly elevated, and although not worrisome at that level, we discussed that if insult continues to occur the liver may not recover as well. - Hepatic function panel; Future  2. Recurrent urticaria Has been stable. Uses antihistamines prn.   3. ADD (attention deficit disorder) without hyperactivity Well controlled with vyvanse. Not using daily. OK for refills x 6 months.   Return in about 6 months  (around 02/16/2019) for physical exam.  Theodis Shove, MD

## 2018-08-19 MED ORDER — LISDEXAMFETAMINE DIMESYLATE 20 MG PO CAPS: 20.0000 mg | ORAL_CAPSULE | Freq: Every day | ORAL | 0 refills | Status: DC | PRN

## 2018-08-19 MED FILL — VYVANSE 20 MG CAPSULE: 20 | 30 days supply | Qty: 30 | Fill #0

## 2018-09-20 MED FILL — VYVANSE 20 MG CAPSULE: 20 | 30 days supply | Qty: 30 | Fill #1

## 2018-09-21 ENCOUNTER — Telehealth: Payer: Self-pay

## 2018-09-21 NOTE — Telephone Encounter (Signed)
PA for lisdexamfetamine (VYVANSE) 20 MG capsule has been sent to cover my meds.  Key: [id number]

## 2018-10-13 NOTE — Telephone Encounter (Signed)
PA has been approved.  Patient is aware.  

## 2018-10-26 ENCOUNTER — Other Ambulatory Visit: Payer: Self-pay | Admitting: Family Medicine

## 2018-10-26 DIAGNOSIS — F988 Other specified behavioral and emotional disorders with onset usually occurring in childhood and adolescence: Secondary | ICD-10-CM

## 2018-10-27 ENCOUNTER — Other Ambulatory Visit: Payer: Self-pay | Admitting: Family Medicine

## 2018-10-27 DIAGNOSIS — F988 Other specified behavioral and emotional disorders with onset usually occurring in childhood and adolescence: Secondary | ICD-10-CM

## 2018-10-27 NOTE — Telephone Encounter (Signed)
Last Fill 08/19/2018 Last OV 08/18/18  Ok to fill?

## 2018-10-29 MED FILL — VYVANSE 20 MG CAPSULE: 20 | 30 days supply | Qty: 30 | Fill #0

## 2018-10-29 NOTE — Telephone Encounter (Signed)
Pt called in to check the status of Rx  ° ° ° °

## 2018-12-01 ENCOUNTER — Other Ambulatory Visit: Payer: Self-pay | Admitting: Family Medicine

## 2018-12-01 ENCOUNTER — Telehealth: Payer: Self-pay | Admitting: Family Medicine

## 2018-12-01 DIAGNOSIS — F988 Other specified behavioral and emotional disorders with onset usually occurring in childhood and adolescence: Secondary | ICD-10-CM

## 2018-12-01 MED ORDER — LISDEXAMFETAMINE DIMESYLATE 20 MG PO CAPS: ORAL_CAPSULE | ORAL | 0 refills | Status: DC

## 2018-12-01 MED FILL — VYVANSE 20 MG CAPSULE: 20 | 30 days supply | Qty: 30 | Fill #0

## 2018-12-01 NOTE — Telephone Encounter (Signed)
Copied from CRM [id number]. Topic: Quick Communication - Rx Refill/Question >> Dec 01, 2018  3:16 PM Randol Kern wrote: Medication: VYVANSE -pt requesting refill. Please advise if appt is necessary.  Has the patient contacted their pharmacy? no (Agent: If no, request that the patient contact the pharmacy for the refill.) (Agent: If yes, when and what did the pharmacy advise?)  Preferred Pharmacy (with phone number or street name): [facility] - [place], [place] - [address] [place] [address]    Agent: Please be advised that RX refills may take up to 3 business days. We ask that you follow-up with your pharmacy.

## 2018-12-01 NOTE — Telephone Encounter (Signed)
Refill sent in

## 2018-12-02 NOTE — Telephone Encounter (Signed)
Noted  

## 2019-01-06 ENCOUNTER — Other Ambulatory Visit: Payer: Self-pay | Admitting: Family Medicine

## 2019-01-06 DIAGNOSIS — F988 Other specified behavioral and emotional disorders with onset usually occurring in childhood and adolescence: Secondary | ICD-10-CM

## 2019-01-19 NOTE — Telephone Encounter (Signed)
Patient is checking on his request to have his lisdexamfetamine (VYVANSE) 20 MG capsule prescription refilled, and sent to his preferred pharmacy [facility].

## 2019-01-20 NOTE — Telephone Encounter (Signed)
Looks like patient has not seen PCP in some time.Is he available for a phone or video visit today to check in then can send refill? PCP is out of the office until Monday - but I am happy to call or video visit with him today if needed.Thanks!

## 2019-01-20 NOTE — Telephone Encounter (Signed)
I left a message for the pt to return my call.  CRM also created. 

## 2019-01-21 NOTE — Telephone Encounter (Signed)
Pt called Friday am to follow up on Rx request. Advised pt he needed appt, virtual or in office, for further refills.   Pt verbalized understanding and will cb Monday for appt.

## 2019-01-26 MED FILL — VYVANSE 20 MG CAPSULE: 20 | 30 days supply | Qty: 30 | Fill #0

## 2019-01-31 ENCOUNTER — Telehealth: Payer: Self-pay | Admitting: Family Medicine

## 2019-01-31 NOTE — Telephone Encounter (Signed)
I sent it there on 7/8. Did they not get rx? Can you check with pharmacy?

## 2019-01-31 NOTE — Telephone Encounter (Signed)
Pt state that he has been out of his medication for a while Rx Vyvanse 20 MG.  Pharm:  [facility]

## 2019-02-01 NOTE — Telephone Encounter (Signed)
I called Phillip Burns and spoke with Junie Panning.  She stated the Rx was filled and picked up on 7/8. I left a detailed message with this information at the pts cell number.

## 2019-02-16 ENCOUNTER — Other Ambulatory Visit: Payer: Self-pay

## 2019-02-16 ENCOUNTER — Ambulatory Visit (INDEPENDENT_AMBULATORY_CARE_PROVIDER_SITE_OTHER): Payer: BC Managed Care – PPO | Admitting: Family Medicine

## 2019-02-16 ENCOUNTER — Encounter: Payer: Self-pay | Admitting: Family Medicine

## 2019-02-16 DIAGNOSIS — E785 Hyperlipidemia, unspecified: Secondary | ICD-10-CM

## 2019-02-16 DIAGNOSIS — R748 Abnormal levels of other serum enzymes: Secondary | ICD-10-CM

## 2019-02-16 DIAGNOSIS — F988 Other specified behavioral and emotional disorders with onset usually occurring in childhood and adolescence: Secondary | ICD-10-CM | POA: Diagnosis not present

## 2019-02-16 NOTE — Progress Notes (Signed)
Virtual Visit via Telephone Note  I connected with Phillip Burns  on 02/16/19 at  2:30 PM EDT by telephone and verified that I am speaking with the correct person using two identifiers.   I discussed the limitations, risks, security and privacy concerns of performing an evaluation and management service by telephone and the availability of in person appointments. I also discussed with the patient that there may be a patient responsible charge related to this service. The patient expressed understanding and agreed to proceed.  Location patient: home Location provider: work office Participants present for the call: patient, provider Patient did not have a visit in the prior 7 days to address this/these issue(s).   History of Present Illness:   This is a 33 y.o. male who presents with Chief Complaint  Patient presents with  . Follow-up    History of present illness: Things have been going well for him.   Usually hives stop in summer time. Hasn't been bad.   Doing ok from ADD standpoint: does well with vyvanse. Using just on work days.  Does help him with focus, completing work, staying on task.  Sleeping ok overall. No anxiety or depression.   Alcohol intake - hasn't drank since June 1st. Taking a few month break.  Review of Systems  Constitutional: Negative for chills, fatigue and fever.  Respiratory: Negative for cough, chest tightness, shortness of breath and wheezing.   Cardiovascular: Negative for chest pain, palpitations and leg swelling.  Psychiatric/Behavioral: Negative for agitation and decreased concentration. The patient is not nervous/anxious.   Skin: has not had hives during summer months.   Observations/Objective: Patient sounds cheerful and well on the phone. I do not appreciate any SOB. Speech and thought processing are grossly intact.   Assessment and Plan: 1. ADD (attention deficit disorder) without hyperactivity Does well with Vyvanse.  Uses just on  days that he is working.  Okay for follow-up in 6 months time.  2. Elevated liver enzymes We discussed liver irritants in the past.  He is not drinking alcohol and cautious about medications and supplements.  We will plan to recheck liver enzymes in a couple of months to make sure no further elevation.  3. Hyperlipidemia: will recheck labs for stability  Follow Up Instructions: Return in about 6 months (around 08/19/2019) for physical exam.   I did not refer this patient for an OV in the next 24 hours for this/these issue(s).  I discussed the assessment and treatment plan with the patient. The patient was provided an opportunity to ask questions and all were answered. The patient agreed with the plan and demonstrated an understanding of the instructions.   The patient was advised to call back or seek an in-person evaluation if the symptoms worsen or if the condition fails to improve as anticipated.  I provided 10 minutes of non-face-to-face time during this encounter.   Phillip Rough, MD

## 2019-02-17 ENCOUNTER — Telehealth: Payer: Self-pay | Admitting: *Deleted

## 2019-02-17 NOTE — Telephone Encounter (Signed)
-----   Message from Caren Macadam, MD sent at 02/17/2019  6:56 AM EDT ----- Can you please schedule bloodwork visit for him end of August-early September?

## 2019-02-17 NOTE — Telephone Encounter (Signed)
Appt scheduled for 8/28.

## 2019-03-03 ENCOUNTER — Other Ambulatory Visit: Payer: Self-pay | Admitting: Family Medicine

## 2019-03-03 DIAGNOSIS — F988 Other specified behavioral and emotional disorders with onset usually occurring in childhood and adolescence: Secondary | ICD-10-CM

## 2019-03-03 MED FILL — VYVANSE 20 MG CAPSULE: 20 | 30 days supply | Qty: 30 | Fill #0

## 2019-03-03 NOTE — Telephone Encounter (Signed)
Vyvanse refilled in Dr Berenice Bouton absence

## 2019-03-18 ENCOUNTER — Other Ambulatory Visit: Payer: BC Managed Care – PPO

## 2019-04-13 ENCOUNTER — Other Ambulatory Visit: Payer: Self-pay | Admitting: Family Medicine

## 2019-04-13 DIAGNOSIS — F988 Other specified behavioral and emotional disorders with onset usually occurring in childhood and adolescence: Secondary | ICD-10-CM

## 2019-04-15 MED FILL — VYVANSE 20 MG CAPSULE: 20 | 30 days supply | Qty: 30 | Fill #0

## 2019-05-31 ENCOUNTER — Other Ambulatory Visit: Payer: Self-pay | Admitting: Family Medicine

## 2019-05-31 DIAGNOSIS — F988 Other specified behavioral and emotional disorders with onset usually occurring in childhood and adolescence: Secondary | ICD-10-CM

## 2019-05-31 MED FILL — VYVANSE 20 MG CAPSULE: 20 | 30 days supply | Qty: 30 | Fill #0

## 2019-06-28 ENCOUNTER — Other Ambulatory Visit: Payer: Self-pay | Admitting: Family Medicine

## 2019-06-28 DIAGNOSIS — F988 Other specified behavioral and emotional disorders with onset usually occurring in childhood and adolescence: Secondary | ICD-10-CM

## 2019-06-29 MED FILL — VYVANSE 20 MG CAPSULE: 20 | 30 days supply | Qty: 30 | Fill #0

## 2019-08-29 ENCOUNTER — Other Ambulatory Visit: Payer: Self-pay | Admitting: Family Medicine

## 2019-08-29 DIAGNOSIS — F988 Other specified behavioral and emotional disorders with onset usually occurring in childhood and adolescence: Secondary | ICD-10-CM

## 2019-08-31 ENCOUNTER — Telehealth: Payer: Self-pay | Admitting: Family Medicine

## 2019-08-31 MED FILL — VYVANSE 20 MG CAPSULE: 20 | 30 days supply | Qty: 30 | Fill #0

## 2019-08-31 NOTE — Telephone Encounter (Signed)
I sent that earlier in day. Please confirm pharmacy receipt.

## 2019-08-31 NOTE — Telephone Encounter (Signed)
Medication Refill: Vyvanse Pharmacy: [facility] [phone]

## 2019-09-01 NOTE — Telephone Encounter (Signed)
Spoke with the pharmacist [place] and she stated the Rx was received.

## 2019-09-27 ENCOUNTER — Other Ambulatory Visit: Payer: Self-pay | Admitting: Family Medicine

## 2019-09-27 DIAGNOSIS — F988 Other specified behavioral and emotional disorders with onset usually occurring in childhood and adolescence: Secondary | ICD-10-CM

## 2019-09-27 NOTE — Telephone Encounter (Signed)
Pt is calling in stating that he is out of his Rx lisdexamfetamine VYVANSE 20 MG   Pharm:  [facility]  Pt is aware that the provider is out of the office and will return on Wednesday.

## 2019-09-28 MED FILL — VYVANSE 20 MG CAPSULE: 20 | 30 days supply | Qty: 30 | Fill #0

## 2019-10-25 ENCOUNTER — Telehealth: Payer: Self-pay | Admitting: Family Medicine

## 2019-10-25 ENCOUNTER — Other Ambulatory Visit: Payer: Self-pay | Admitting: Family Medicine

## 2019-10-25 DIAGNOSIS — F988 Other specified behavioral and emotional disorders with onset usually occurring in childhood and adolescence: Secondary | ICD-10-CM

## 2019-10-25 NOTE — Telephone Encounter (Signed)
lisdexamfetamine (VYVANSE) 20 MG capsule  [facility] [place], [place] - [address] [place] Phone:  [phone]  Fax:  [phone]

## 2019-10-26 MED FILL — VYVANSE 20 MG CAPSULE: 20 | 30 days supply | Qty: 30 | Fill #0

## 2019-10-26 NOTE — Telephone Encounter (Signed)
Spoke with Penni Bombard at [facility] and she stated the Rx was received.

## 2019-10-26 NOTE — Telephone Encounter (Signed)
This was sent yesterday; please confirm they received.

## 2019-11-23 ENCOUNTER — Telehealth: Payer: Self-pay | Admitting: Family Medicine

## 2019-11-23 NOTE — Telephone Encounter (Signed)
Medication refill:  Vyvanse  Pharmacy: [facility] Fax: [phone]

## 2019-11-24 ENCOUNTER — Other Ambulatory Visit: Payer: Self-pay | Admitting: Family Medicine

## 2019-11-24 DIAGNOSIS — F988 Other specified behavioral and emotional disorders with onset usually occurring in childhood and adolescence: Secondary | ICD-10-CM

## 2019-11-24 MED ORDER — LISDEXAMFETAMINE DIMESYLATE 20 MG PO CAPS: ORAL_CAPSULE | ORAL | 0 refills | Status: DC

## 2019-11-24 MED FILL — VYVANSE 20 MG CAPSULE: 20 | 30 days supply | Qty: 30 | Fill #0

## 2019-11-24 NOTE — Telephone Encounter (Signed)
I left a detailed message with the information below at the pts cell number and also left a message to remind the pt there is a 48-72 turnaround time for refills also.

## 2019-11-24 NOTE — Telephone Encounter (Signed)
Refill was just sent.  Patient just requested yesterday and actually won't be 30 days out from last rx 4/7 until tomorrow just fyi.

## 2019-11-24 NOTE — Telephone Encounter (Signed)
The patient was wondering if he was needing to make an appointment to get the refill or is [facility] going to refill his medication.   He hasn't heard anything from the pharmacy  Please advise

## 2019-12-23 ENCOUNTER — Telehealth: Payer: Self-pay | Admitting: Family Medicine

## 2019-12-23 ENCOUNTER — Other Ambulatory Visit: Payer: Self-pay | Admitting: Family Medicine

## 2019-12-23 DIAGNOSIS — F988 Other specified behavioral and emotional disorders with onset usually occurring in childhood and adolescence: Secondary | ICD-10-CM

## 2019-12-23 MED ORDER — LISDEXAMFETAMINE DIMESYLATE 20 MG PO CAPS: ORAL_CAPSULE | ORAL | 0 refills | Status: DC

## 2019-12-23 MED FILL — VYVANSE 20 MG CAPSULE: 20 | 30 days supply | Qty: 30 | Fill #0

## 2019-12-23 NOTE — Telephone Encounter (Signed)
Pt calling in needing a refill on lisdexamfetamine (VYVANSE) 20 MG stating he is out.   Pharm:  [facility]

## 2019-12-23 NOTE — Telephone Encounter (Signed)
done

## 2020-01-24 ENCOUNTER — Telehealth: Payer: Self-pay | Admitting: Family Medicine

## 2020-01-24 NOTE — Telephone Encounter (Signed)
Pt is calling in stating that he need a refill on lisdexamfetamine (VYVANSE) 20 MG.   Pharm:  [facility]

## 2020-01-25 ENCOUNTER — Other Ambulatory Visit: Payer: Self-pay | Admitting: Family Medicine

## 2020-01-25 DIAGNOSIS — F988 Other specified behavioral and emotional disorders with onset usually occurring in childhood and adolescence: Secondary | ICD-10-CM

## 2020-01-25 MED ORDER — LISDEXAMFETAMINE DIMESYLATE 20 MG PO CAPS: ORAL_CAPSULE | ORAL | 0 refills | Status: DC

## 2020-01-25 MED FILL — VYVANSE 20 MG CAPSULE: 20 | 30 days supply | Qty: 30 | Fill #0

## 2020-01-25 NOTE — Telephone Encounter (Signed)
Refill sent.

## 2020-02-21 ENCOUNTER — Other Ambulatory Visit: Payer: Self-pay | Admitting: Family Medicine

## 2020-02-21 ENCOUNTER — Telehealth: Payer: Self-pay | Admitting: Family Medicine

## 2020-02-21 DIAGNOSIS — F988 Other specified behavioral and emotional disorders with onset usually occurring in childhood and adolescence: Secondary | ICD-10-CM

## 2020-02-21 NOTE — Telephone Encounter (Signed)
Pt called to have medication lisdexamfetamine (VYVANSE) 20 MG capsule refilled.  Going out of town and would like to get the medication early. He will be gone for 2 weeks.  Please advise

## 2020-02-22 ENCOUNTER — Other Ambulatory Visit: Payer: Self-pay | Admitting: Family Medicine

## 2020-02-22 DIAGNOSIS — F988 Other specified behavioral and emotional disorders with onset usually occurring in childhood and adolescence: Secondary | ICD-10-CM

## 2020-02-22 MED ORDER — LISDEXAMFETAMINE DIMESYLATE 20 MG PO CAPS: ORAL_CAPSULE | ORAL | 0 refills | Status: DC

## 2020-02-22 MED FILL — VYVANSE 20 MG CAPSULE: 20 | 30 days supply | Qty: 30 | Fill #0

## 2020-02-22 NOTE — Telephone Encounter (Signed)
Noted  

## 2020-02-22 NOTE — Telephone Encounter (Signed)
Med has been refilled.

## 2020-03-19 ENCOUNTER — Telehealth: Payer: Self-pay | Admitting: Family Medicine

## 2020-03-19 NOTE — Telephone Encounter (Signed)
Pt needs medication lisdexamfetamine (VYVANSE) 20 MG capsule   Refilled and sent to   [facility] - [place], [place] - [address]  [address] [place], [place] [address]  Phone:  [phone] Fax:  [phone]

## 2020-03-20 ENCOUNTER — Other Ambulatory Visit: Payer: Self-pay | Admitting: Family Medicine

## 2020-03-20 DIAGNOSIS — F988 Other specified behavioral and emotional disorders with onset usually occurring in childhood and adolescence: Secondary | ICD-10-CM

## 2020-03-21 ENCOUNTER — Other Ambulatory Visit: Payer: Self-pay | Admitting: Family Medicine

## 2020-03-21 DIAGNOSIS — F988 Other specified behavioral and emotional disorders with onset usually occurring in childhood and adolescence: Secondary | ICD-10-CM

## 2020-03-21 MED ORDER — LISDEXAMFETAMINE DIMESYLATE 20 MG PO CAPS: ORAL_CAPSULE | ORAL | 0 refills | Status: DC

## 2020-03-21 MED FILL — VYVANSE 20 MG CAPSULE: 20 | 30 days supply | Qty: 30 | Fill #0

## 2020-05-01 ENCOUNTER — Other Ambulatory Visit: Payer: Self-pay | Admitting: Family Medicine

## 2020-05-01 DIAGNOSIS — F988 Other specified behavioral and emotional disorders with onset usually occurring in childhood and adolescence: Secondary | ICD-10-CM

## 2020-05-01 MED FILL — VYVANSE 20 MG CAPSULE: 20 | 30 days supply | Qty: 30 | Fill #0

## 2020-05-01 NOTE — Telephone Encounter (Signed)
Has to have visit before further refills. I have sent one today. Due for physical

## 2020-05-01 NOTE — Telephone Encounter (Signed)
Left a detailed message at the pts cell number with the information below. 

## 2020-06-19 ENCOUNTER — Telehealth: Payer: Self-pay | Admitting: Family Medicine

## 2020-06-19 NOTE — Telephone Encounter (Signed)
Pt is calling in to check the status of his medication and stated he was told his medication will be called in today and is aware that his provider is not in the office and will get the msg on tomorrow.

## 2020-06-19 NOTE — Telephone Encounter (Signed)
pt is requesting a refill for his medication  lisdexamfetamine (VYVANSE) 20 MG capsule  his next appt is not until 07/27/2020 [facility] - [place], [place] - [address] [place]  Phone:  [phone]

## 2020-06-20 ENCOUNTER — Other Ambulatory Visit: Payer: Self-pay | Admitting: Family Medicine

## 2020-06-20 DIAGNOSIS — F988 Other specified behavioral and emotional disorders with onset usually occurring in childhood and adolescence: Secondary | ICD-10-CM

## 2020-06-20 MED ORDER — LISDEXAMFETAMINE DIMESYLATE 20 MG PO CAPS: ORAL_CAPSULE | ORAL | 0 refills | Status: DC

## 2020-06-20 MED FILL — VYVANSE 20 MG CAPSULE: 20 | 30 days supply | Qty: 30 | Fill #0

## 2020-06-20 NOTE — Telephone Encounter (Signed)
Refill has been sent. For record; this is first I have seen as request for this? Sorry if there was delay or request earlier that was missed.

## 2020-06-20 NOTE — Telephone Encounter (Signed)
Unable to leave a message due to voicemail being full. 

## 2020-07-27 ENCOUNTER — Telehealth: Payer: BC Managed Care – PPO | Admitting: Family Medicine

## 2020-08-15 ENCOUNTER — Other Ambulatory Visit: Payer: Self-pay | Admitting: Family Medicine

## 2020-08-15 ENCOUNTER — Other Ambulatory Visit: Payer: Self-pay

## 2020-08-15 ENCOUNTER — Telehealth (INDEPENDENT_AMBULATORY_CARE_PROVIDER_SITE_OTHER): Payer: Self-pay | Admitting: Family Medicine

## 2020-08-15 DIAGNOSIS — Z131 Encounter for screening for diabetes mellitus: Secondary | ICD-10-CM

## 2020-08-15 DIAGNOSIS — Z13220 Encounter for screening for lipoid disorders: Secondary | ICD-10-CM

## 2020-08-15 DIAGNOSIS — F988 Other specified behavioral and emotional disorders with onset usually occurring in childhood and adolescence: Secondary | ICD-10-CM

## 2020-08-15 MED ORDER — LISDEXAMFETAMINE DIMESYLATE 20 MG PO CAPS: ORAL_CAPSULE | ORAL | 0 refills | Status: DC

## 2020-08-15 NOTE — Progress Notes (Signed)
Virtual Visit via Video Note  I connected with Phillip Burns  on 08/15/20 at  1:00 PM EST by a video enabled telemedicine application and verified that I am speaking with the correct person using two identifiers.  Location patient: home Location provider: [facility]  [address] [place], [place] [address] Persons participating in the virtual visit: patient, provider  I discussed the limitations of evaluation and management by telemedicine and the availability of in person appointments. The patient expressed understanding and agreed to proceed.   Phillip Burns DOB: 1985/09/26 Encounter date: 08/15/2020  This is a 35 y.o. male who presents with No chief complaint on file.   History of present illness: Doing well. No concerns today.   Had COVID 2 weeks ago - hard for two days then did ok.   Hasn't been having much cold hives. Had outbreak a couple of weeks ago, but didn't take antihistamine for it.    vyvanse works well. Appetite not suppressed. Sleeping ok. Just takes on work days and helps with focus, task completion. Usually does not take when not working.  No Known Allergies No outpatient medications have been marked as taking for the 08/15/20 encounter (Appointment) with Wynn Banker, MD.    Review of Systems  Constitutional: Negative for chills, fatigue and fever.  Respiratory: Negative for cough, chest tightness, shortness of breath and wheezing.   Cardiovascular: Negative for chest pain, palpitations and leg swelling.    Objective:  There were no vitals taken for this visit.      BP Readings from Last 3 Encounters:  08/18/18 120/70  07/07/18 120/80  04/20/18 108/70   Wt Readings from Last 3 Encounters:  08/18/18 207 lb (93.9 kg)  07/07/18 209 lb 9.6 oz (95.1 kg)  04/20/18 198 lb 9.6 oz (90.1 kg)    EXAM:  GENERAL: alert, oriented, appears well and in no acute distress  HEENT: atraumatic, conjunctiva clear, no obvious  abnormalities on inspection of external nose and ears  NECK: normal movements of the head and neck  LUNGS: on inspection no signs of respiratory distress, breathing rate appears normal, no obvious gross SOB, gasping or wheezing  CV: no obvious cyanosis  MS: moves all visible extremities without noticeable abnormality  PSYCH/NEURO: pleasant and cooperative, no obvious depression or anxiety, speech and thought processing grossly intact   Assessment/Plan  1. Attention deficit disorder (ADD) without hyperactivity Doing well with vyvanse. Continue with current dose.  - lisdexamfetamine (VYVANSE) 20 MG capsule; TAKE 1 CAPSULE BY MOUTH ONCE DAILY AS NEEDED FOR ADD  Dispense: 30 capsule; Refill: 0  2. Lipid screening - Lipid panel; Future  3. Screening for diabetes mellitus - Comprehensive metabolic panel; Future   Return in about 8 months (around 04/15/2021) for physical exam; labwork before or at visit.   I discussed the assessment and treatment plan with the patient. The patient was provided an opportunity to ask questions and all were answered. The patient agreed with the plan and demonstrated an understanding of the instructions.   The patient was advised to call back or seek an in-person evaluation if the symptoms worsen or if the condition fails to improve as anticipated.  I provided 15 minutes of non-face-to-face time during this encounter.   Theodis Shove, MD

## 2020-09-14 MED FILL — VYVANSE 20 MG CAPSULE: 20 | 30 days supply | Qty: 30 | Fill #0

## 2020-09-25 ENCOUNTER — Telehealth: Payer: Self-pay | Admitting: Family Medicine

## 2020-09-25 NOTE — Telephone Encounter (Signed)
Pt is calling in needing a refill on Rx lisdexamfetamine (VYVANSE) 20 MG  Pharm:  [facility]

## 2020-09-26 ENCOUNTER — Other Ambulatory Visit: Payer: Self-pay | Admitting: Family Medicine

## 2020-09-26 DIAGNOSIS — F988 Other specified behavioral and emotional disorders with onset usually occurring in childhood and adolescence: Secondary | ICD-10-CM

## 2020-09-26 MED ORDER — LISDEXAMFETAMINE DIMESYLATE 20 MG PO CAPS: ORAL_CAPSULE | ORAL | 0 refills | Status: DC

## 2020-09-26 NOTE — Telephone Encounter (Signed)
done

## 2020-10-22 ENCOUNTER — Other Ambulatory Visit (HOSPITAL_COMMUNITY): Payer: Self-pay

## 2020-10-22 MED FILL — Lisdexamfetamine Dimesylate Cap 20 MG: ORAL | 30 days supply | Qty: 30 | Fill #0 | Status: AC

## 2021-04-15 ENCOUNTER — Other Ambulatory Visit (HOSPITAL_COMMUNITY): Payer: Self-pay

## 2021-04-15 ENCOUNTER — Other Ambulatory Visit: Payer: Self-pay | Admitting: Family Medicine

## 2021-04-15 ENCOUNTER — Telehealth: Payer: Self-pay | Admitting: *Deleted

## 2021-04-15 DIAGNOSIS — F988 Other specified behavioral and emotional disorders with onset usually occurring in childhood and adolescence: Secondary | ICD-10-CM

## 2021-04-15 MED ORDER — LISDEXAMFETAMINE DIMESYLATE 20 MG PO CAPS
ORAL_CAPSULE | ORAL | 0 refills | Status: DC
  Filled 2021-04-15: qty 30, 30d supply, fill #0

## 2021-04-15 NOTE — Telephone Encounter (Signed)
Fax received from the [facility] stating the patient called to request a refill on Vyvanse sent to [facility].  Message sent to PCP.

## 2021-04-15 NOTE — Telephone Encounter (Signed)
Refill sent.

## 2021-07-24 ENCOUNTER — Other Ambulatory Visit: Payer: Self-pay | Admitting: Family Medicine

## 2021-07-24 ENCOUNTER — Other Ambulatory Visit (HOSPITAL_COMMUNITY): Payer: Self-pay

## 2021-07-24 ENCOUNTER — Telehealth: Payer: Self-pay | Admitting: Family Medicine

## 2021-07-24 DIAGNOSIS — F988 Other specified behavioral and emotional disorders with onset usually occurring in childhood and adolescence: Secondary | ICD-10-CM

## 2021-07-24 MED ORDER — LISDEXAMFETAMINE DIMESYLATE 20 MG PO CAPS
ORAL_CAPSULE | ORAL | 0 refills | Status: DC
  Filled 2021-07-24: qty 30, 30d supply, fill #0

## 2021-07-24 NOTE — Telephone Encounter (Signed)
Pt is calling and needs a refill on lisdexamfetamine (VYVANSE) 20 MG capsule  [facility] Phone:  [phone]  Fax:  [phone]

## 2021-07-25 ENCOUNTER — Other Ambulatory Visit (HOSPITAL_COMMUNITY): Payer: Self-pay

## 2021-08-26 ENCOUNTER — Telehealth: Payer: Self-pay | Admitting: Family Medicine

## 2021-08-26 NOTE — Telephone Encounter (Signed)
Patient called in to request for lisdexamfetamine (VYVANSE) 20 MG capsule [[id number]]  to be sent to his pharmacy.   Patient haven't been seen in over a year so I offered an appointment to him but he's going to call back to schedule that.  Patient could be contacted at [phone].  Please advise.

## 2021-08-28 ENCOUNTER — Other Ambulatory Visit: Payer: Self-pay | Admitting: Family Medicine

## 2021-08-28 ENCOUNTER — Other Ambulatory Visit (HOSPITAL_COMMUNITY): Payer: Self-pay

## 2021-08-28 DIAGNOSIS — F988 Other specified behavioral and emotional disorders with onset usually occurring in childhood and adolescence: Secondary | ICD-10-CM

## 2021-08-28 MED ORDER — LISDEXAMFETAMINE DIMESYLATE 20 MG PO CAPS
ORAL_CAPSULE | ORAL | 0 refills | Status: AC
Start: 2021-08-28 — End: 2022-02-24
  Filled 2021-08-28: qty 30, 30d supply, fill #0

## 2021-08-28 NOTE — Telephone Encounter (Signed)
Med refilled but needs visit before future refills.

## 2021-08-28 NOTE — Telephone Encounter (Signed)
Patient informed of the message below.  Patient stated he will check his schedule and call back for an appt.

## 2021-10-11 ENCOUNTER — Other Ambulatory Visit (HOSPITAL_COMMUNITY): Payer: Self-pay

## 2021-11-11 ENCOUNTER — Other Ambulatory Visit (HOSPITAL_COMMUNITY): Payer: Self-pay

## 2022-01-31 ENCOUNTER — Ambulatory Visit: Payer: Self-pay | Admitting: Family

## 2022-02-12 ENCOUNTER — Ambulatory Visit: Payer: Self-pay | Admitting: Adult Health

## 2022-05-20 ENCOUNTER — Telehealth: Payer: Self-pay | Admitting: Family Medicine

## 2022-05-20 ENCOUNTER — Other Ambulatory Visit (HOSPITAL_COMMUNITY): Payer: Self-pay

## 2022-05-20 NOTE — Telephone Encounter (Signed)
Attempted to call patient to schedule TOC. No answer and voicemail is full.

## 2022-05-20 NOTE — Telephone Encounter (Signed)
Patient informed of the message below.

## 2022-05-20 NOTE — Telephone Encounter (Signed)
Pt has TOC sch for 05-26-2022

## 2022-05-20 NOTE — Telephone Encounter (Signed)
Patient hasn't been seen since January of 2022-- this is almost 2 years since he has been seen. He must come in to be seen if he wants refills.

## 2022-05-20 NOTE — Telephone Encounter (Signed)
Pt has TOC with dr Legrand Como on 05-26-2022 and would like a refill on lisdexamfetamine (VYVANSE) 20 MG capsule  [place] Phone:  [phone]  Fax:  [phone]

## 2022-05-26 ENCOUNTER — Encounter: Payer: Self-pay | Admitting: Family Medicine

## 2022-12-03 ENCOUNTER — Other Ambulatory Visit (HOSPITAL_BASED_OUTPATIENT_CLINIC_OR_DEPARTMENT_OTHER): Payer: Self-pay
